# Patient Record
Sex: Male | Born: 1960
Health system: Southern US, Community
[De-identification: ages and names within clinical notes are randomized; demographics above are authoritative.]

## PROBLEM LIST (undated history)

## (undated) DIAGNOSIS — E785 Hyperlipidemia, unspecified: Secondary | ICD-10-CM

## (undated) DIAGNOSIS — I10 Essential (primary) hypertension: Secondary | ICD-10-CM

## (undated) DIAGNOSIS — L659 Nonscarring hair loss, unspecified: Secondary | ICD-10-CM

## (undated) DIAGNOSIS — E119 Type 2 diabetes mellitus without complications: Secondary | ICD-10-CM

## (undated) DIAGNOSIS — L4050 Arthropathic psoriasis, unspecified: Secondary | ICD-10-CM

## (undated) HISTORY — DX: Essential (primary) hypertension: I10

## (undated) HISTORY — DX: Nonscarring hair loss, unspecified: L65.9

## (undated) HISTORY — PX: TYMPANOPLASTY: SHX33

## (undated) HISTORY — DX: Arthropathic psoriasis, unspecified: L40.50

## (undated) HISTORY — DX: Hyperlipidemia, unspecified: E78.5

---

## 2015-07-21 LAB — HM COLONOSCOPY

## 2016-09-19 DIAGNOSIS — L409 Psoriasis, unspecified: Secondary | ICD-10-CM | POA: Insufficient documentation

## 2017-04-15 DIAGNOSIS — Z79899 Other long term (current) drug therapy: Secondary | ICD-10-CM | POA: Diagnosis not present

## 2017-04-15 DIAGNOSIS — L4059 Other psoriatic arthropathy: Secondary | ICD-10-CM | POA: Diagnosis not present

## 2017-04-21 DIAGNOSIS — Z125 Encounter for screening for malignant neoplasm of prostate: Secondary | ICD-10-CM | POA: Diagnosis not present

## 2017-04-21 DIAGNOSIS — I10 Essential (primary) hypertension: Secondary | ICD-10-CM | POA: Diagnosis not present

## 2017-04-21 DIAGNOSIS — Z23 Encounter for immunization: Secondary | ICD-10-CM | POA: Diagnosis not present

## 2017-04-21 DIAGNOSIS — R739 Hyperglycemia, unspecified: Secondary | ICD-10-CM | POA: Diagnosis not present

## 2017-04-21 DIAGNOSIS — L409 Psoriasis, unspecified: Secondary | ICD-10-CM | POA: Diagnosis not present

## 2017-04-22 LAB — PSA: PSA: 0.6

## 2017-04-22 LAB — HEPATIC FUNCTION PANEL
ALT: 39 (ref 10–40)
AST: 27 (ref 14–40)
BILIRUBIN, TOTAL: 0.6

## 2017-04-22 LAB — HEMOGLOBIN A1C: HEMOGLOBIN A1C: 6.3

## 2017-04-22 LAB — CBC AND DIFFERENTIAL
HEMOGLOBIN: 16 (ref 13.5–17.5)
PLATELETS: 305 (ref 150–399)
WBC: 9.3

## 2017-04-22 LAB — BASIC METABOLIC PANEL
BUN: 10 (ref 4–21)
Creatinine: 0.9 (ref 0.6–1.3)
GLUCOSE: 136
Potassium: 4.3 (ref 3.4–5.3)
Sodium: 140 (ref 137–147)

## 2017-04-22 LAB — LIPID PANEL
Cholesterol: 102 (ref 0–200)
HDL: 26 — AB (ref 35–70)
LDL CALC: 58
TRIGLYCERIDES: 90 (ref 40–160)

## 2017-05-04 DIAGNOSIS — L400 Psoriasis vulgaris: Secondary | ICD-10-CM | POA: Diagnosis not present

## 2017-05-07 DIAGNOSIS — J208 Acute bronchitis due to other specified organisms: Secondary | ICD-10-CM | POA: Diagnosis not present

## 2017-05-07 DIAGNOSIS — B9689 Other specified bacterial agents as the cause of diseases classified elsewhere: Secondary | ICD-10-CM | POA: Diagnosis not present

## 2017-05-09 HISTORY — PX: COLONOSCOPY: SHX174

## 2017-05-21 DIAGNOSIS — B9689 Other specified bacterial agents as the cause of diseases classified elsewhere: Secondary | ICD-10-CM | POA: Diagnosis not present

## 2017-05-21 DIAGNOSIS — J329 Chronic sinusitis, unspecified: Secondary | ICD-10-CM | POA: Diagnosis not present

## 2017-05-21 DIAGNOSIS — M26621 Arthralgia of right temporomandibular joint: Secondary | ICD-10-CM | POA: Diagnosis not present

## 2017-06-17 ENCOUNTER — Ambulatory Visit: Payer: BLUE CROSS/BLUE SHIELD | Admitting: Internal Medicine

## 2017-06-17 ENCOUNTER — Encounter: Payer: Self-pay | Admitting: Internal Medicine

## 2017-06-17 VITALS — BP 118/78 | HR 107 | Ht 72.0 in | Wt 245.0 lb

## 2017-06-17 DIAGNOSIS — I10 Essential (primary) hypertension: Secondary | ICD-10-CM

## 2017-06-17 DIAGNOSIS — L4050 Arthropathic psoriasis, unspecified: Secondary | ICD-10-CM

## 2017-06-17 DIAGNOSIS — R7303 Prediabetes: Secondary | ICD-10-CM

## 2017-06-17 DIAGNOSIS — E1169 Type 2 diabetes mellitus with other specified complication: Secondary | ICD-10-CM | POA: Insufficient documentation

## 2017-06-17 DIAGNOSIS — E1159 Type 2 diabetes mellitus with other circulatory complications: Secondary | ICD-10-CM

## 2017-06-17 DIAGNOSIS — E785 Hyperlipidemia, unspecified: Secondary | ICD-10-CM

## 2017-06-17 DIAGNOSIS — L659 Nonscarring hair loss, unspecified: Secondary | ICD-10-CM | POA: Insufficient documentation

## 2017-06-17 DIAGNOSIS — E782 Mixed hyperlipidemia: Secondary | ICD-10-CM

## 2017-06-17 DIAGNOSIS — E118 Type 2 diabetes mellitus with unspecified complications: Secondary | ICD-10-CM | POA: Insufficient documentation

## 2017-06-17 NOTE — Progress Notes (Signed)
Date:  06/17/2017   Name:  Russell Wright   DOB:  [birth year]   MRN:  [id number]  Transferred from [facility] due to insurance.  Chief Complaint: Establish Care and Fatigue (Feels like has no will to get up and go. Went through divorce in the last year. ) Hypertension  This is a chronic problem. The problem is controlled. Pertinent negatives include no chest pain, headaches, palpitations or shortness of breath. Past treatments include ACE inhibitors. The current treatment provides significant improvement.  Hyperlipidemia  This is a chronic problem. Pertinent negatives include no chest pain or shortness of breath. Current antihyperlipidemic treatment includes statins. The current treatment provides significant improvement of lipids. There are no compliance problems.    Pre-diabetes - has had an A1C 6.5 but did not start metformin as recommended.  A follow up A1C was 6.3.  He tried metformin but could not tolerate the diarrhea.  Psoriatic Arthritis - diagnosed several years ago. Now on MTX and Folate. Primarily knees and one finger. Followed by Dr. Nicoletta Ba.  Fatigue - no longer in a physical job - mostly just driving around.  He has gained some weight and is not exercising.  He admits to a poor diet - eating out more than before.   Review of Systems  Constitutional: Positive for fatigue. Negative for chills, fever and unexpected weight change.  Eyes: Negative for visual disturbance.  Respiratory: Negative for chest tightness, shortness of breath and wheezing.   Cardiovascular: Negative for chest pain, palpitations and leg swelling.  Gastrointestinal: Negative for abdominal pain, constipation and diarrhea.  Musculoskeletal: Positive for arthralgias. Negative for back pain and gait problem.  Skin: Negative for color change and wound.  Neurological: Negative for dizziness, numbness and headaches.  Psychiatric/Behavioral: Negative for sleep disturbance.    Patient Active Problem List   Diagnosis  Date Noted  . Psoriatic arthritis (HCC) 06/17/2017  . Essential hypertension 06/17/2017  . Hyperlipemia 06/17/2017  . Hair loss     Prior to Admission medications   Medication Sig Start Date End Date Taking? Authorizing Provider  atorvastatin (LIPITOR) 20 MG tablet Take 20 mg by mouth daily.   Yes [provider]  folic acid (FOLVITE) 1 MG tablet Take 1 mg by mouth daily.   Yes [provider]  lisinopril (PRINIVIL,ZESTRIL) 20 MG tablet Take 20 mg by mouth daily.   Yes [provider]  methotrexate 2.5 MG tablet Take by mouth once a week. Weekly on thursdays.   Yes [provider]    No Known Allergies  Past Surgical History:  Procedure Laterality Date  . COLONOSCOPY  05/09/2017   cleared for 10 years  . TYMPANOPLASTY     repair- 57 years old    Social History   Tobacco Use  . Smoking status: Never Smoker  . Smokeless tobacco: Never Used  Substance Use Topics  . Alcohol use: Yes    Comment: occasions  . Drug use: No     Medication list has been reviewed and updated.  PHQ 2/9 Scores 06/17/2017  PHQ - 2 Score 0    Physical Exam  Constitutional: He is oriented to person, place, and time. He appears well-developed. No distress.  HENT:  Head: Normocephalic and atraumatic.  Cardiovascular: Normal rate, regular rhythm and normal heart sounds.  Pulmonary/Chest: Effort normal. No respiratory distress.  Musculoskeletal: He exhibits no edema.  Neurological: He is alert and oriented to person, place, and time.  Skin: Skin is warm and dry. No rash  noted.  Psychiatric: He has a normal mood and affect. His behavior is normal. Thought content normal.  Nursing note and vitals reviewed.   BP 118/78   Pulse (!) 107   Ht 6' (1.829 m)   Wt 245 lb (111.1 kg)   SpO2 95%   BMI 33.23 kg/m   Assessment and Plan: 1. Essential hypertension controlled  2. Mixed hyperlipidemia On statin therapy  3. Pre-diabetes Stressed need for healthy  diet, regular exercise and weight loss No medications for now - recheck at next visit  4. Psoriatic arthritis (HCC) On MTX Followed by Rheumatology   No orders of the defined types were placed in this encounter.   Partially dictated using Animal nutritionistDragon software. Any errors are unintentional.  Bari EdwardLaura Akasia Ahmad, MD [facility]  06/17/2017

## 2017-06-17 NOTE — Patient Instructions (Addendum)
B complex vitamin take daily  Aerobic exercise - 30-45 min 5 times per week

## 2017-08-03 DIAGNOSIS — L820 Inflamed seborrheic keratosis: Secondary | ICD-10-CM | POA: Diagnosis not present

## 2017-08-03 DIAGNOSIS — L400 Psoriasis vulgaris: Secondary | ICD-10-CM | POA: Diagnosis not present

## 2017-08-10 DIAGNOSIS — L4050 Arthropathic psoriasis, unspecified: Secondary | ICD-10-CM | POA: Diagnosis not present

## 2017-08-10 LAB — HEPATIC FUNCTION PANEL
ALT: 47 — AB (ref 10–40)
AST: 36 (ref 14–40)

## 2017-08-10 LAB — CBC AND DIFFERENTIAL
HEMOGLOBIN: 15.9 (ref 13.5–17.5)
PLATELETS: 270 (ref 150–399)
WBC: 8.8

## 2017-08-10 LAB — BASIC METABOLIC PANEL: Creatinine: 0.8 (ref 0.6–1.3)

## 2017-08-11 LAB — CBC AND DIFFERENTIAL
HCT: 45 (ref 41–53)
Hemoglobin: 15.9 (ref 13.5–17.5)
Platelets: 270 (ref 150–399)
WBC: 8.8

## 2017-08-11 LAB — HEPATIC FUNCTION PANEL
ALT: 47 — AB (ref 10–40)
AST: 36 (ref 14–40)
Alkaline Phosphatase: 98 (ref 25–125)

## 2017-08-12 ENCOUNTER — Encounter: Payer: Self-pay | Admitting: Internal Medicine

## 2017-09-15 ENCOUNTER — Ambulatory Visit: Payer: BLUE CROSS/BLUE SHIELD | Admitting: Internal Medicine

## 2017-09-15 ENCOUNTER — Encounter: Payer: Self-pay | Admitting: Internal Medicine

## 2017-09-15 VITALS — BP 138/94 | HR 69 | Ht 72.0 in | Wt 239.0 lb

## 2017-09-15 DIAGNOSIS — R7303 Prediabetes: Secondary | ICD-10-CM | POA: Diagnosis not present

## 2017-09-15 DIAGNOSIS — G479 Sleep disorder, unspecified: Secondary | ICD-10-CM | POA: Diagnosis not present

## 2017-09-15 DIAGNOSIS — E782 Mixed hyperlipidemia: Secondary | ICD-10-CM | POA: Diagnosis not present

## 2017-09-15 DIAGNOSIS — I10 Essential (primary) hypertension: Secondary | ICD-10-CM

## 2017-09-15 MED ORDER — LISINOPRIL 40 MG PO TABS: 40.0000 mg | ORAL_TABLET | Freq: Every day | ORAL | 1 refills | Status: DC

## 2017-09-15 NOTE — Progress Notes (Signed)
Date:  09/15/2017   Name:  Russell DownsBradley Fuerstenberg   DOB:  1961/03/10   MRN:  [id number]   Chief Complaint: Hypertension Hypertension  Pertinent negatives include no chest pain, headaches, palpitations or shortness of breath.  Diabetes  Pertinent negatives for hypoglycemia include no headaches or tremors. Pertinent negatives for diabetes include no chest pain, no fatigue, no polydipsia and no polyuria.  Hyperlipidemia  Pertinent negatives include no chest pain or shortness of breath.  Insomnia  Primary symptoms: sleep disturbance (wakes up often and can't go back to sleep), premature morning awakening.  The problem occurs every several days. The problem is unchanged. Exacerbated by: sleeps in different places - with financee, at home or at parents house.  Psoriatic Arthritis - now on higher dose MTX and Folic acid.  Recent renal function, LFTs and CBC normal.  Lab Results  Component Value Date   HGBA1C 6.3 04/22/2017   Lab Results  Component Value Date   CHOL 102 04/22/2017   HDL 26 (A) 04/22/2017   LDLCALC 58 04/22/2017   TRIG 90 04/22/2017   Lab Results  Component Value Date   CREATININE 0.9 04/22/2017   BUN 10 04/22/2017   NA 140 04/22/2017   K 4.3 04/22/2017      Review of Systems  Constitutional: Negative for appetite change, fatigue and unexpected weight change.  Eyes: Negative for visual disturbance.  Respiratory: Negative for cough, shortness of breath and wheezing.   Cardiovascular: Negative for chest pain, palpitations and leg swelling.  Gastrointestinal: Negative for abdominal pain and blood in stool.  Endocrine: Negative for polydipsia and polyuria.  Genitourinary: Negative for dysuria and hematuria.  Skin: Negative for color change and rash.  Neurological: Negative for tremors, numbness and headaches.  Psychiatric/Behavioral: Positive for sleep disturbance (wakes up often and can't go back to sleep). Negative for dysphoric mood. The patient has insomnia.      Patient Active Problem List   Diagnosis Date Noted  . Psoriatic arthritis (HCC) 06/17/2017  . Essential hypertension 06/17/2017  . Hyperlipemia 06/17/2017  . Pre-diabetes 06/17/2017  . Hair loss   . Psoriasis, unspecified 09/19/2016    Prior to Admission medications   Medication Sig Start Date End Date Taking? Authorizing Provider  atorvastatin (LIPITOR) 20 MG tablet Take 20 mg by mouth daily.    [provider]  folic acid (FOLVITE) 1 MG tablet Take 1 mg by mouth daily.    [provider]  lisinopril (PRINIVIL,ZESTRIL) 20 MG tablet Take 20 mg by mouth daily.    [provider]  methotrexate 2.5 MG tablet Take by mouth once a week. Weekly on thursdays.    [provider]    No Known Allergies  Past Surgical History:  Procedure Laterality Date  . COLONOSCOPY  05/09/2017   cleared for 10 years  . TYMPANOPLASTY     repair- 57 years old    Social History   Tobacco Use  . Smoking status: Never Smoker  . Smokeless tobacco: Never Used  Substance Use Topics  . Alcohol use: Yes    Comment: occasions  . Drug use: No     Medication list has been reviewed and updated.  PHQ 2/9 Scores 06/17/2017  PHQ - 2 Score 0    Physical Exam  Constitutional: He is oriented to person, place, and time. He appears well-developed. No distress.  HENT:  Head: Normocephalic and atraumatic.  Neck: Normal range of motion. Neck supple. Carotid bruit is not present. No thyromegaly present.  Cardiovascular: Normal rate, regular rhythm and normal heart sounds.  Pulmonary/Chest: Effort normal and breath sounds normal. No respiratory distress. He has no wheezes.  Musculoskeletal: Normal range of motion. He exhibits no edema or tenderness.  Neurological: He is alert and oriented to person, place, and time.  Skin: Skin is warm and dry. No rash noted.  Psychiatric: He has a normal mood and affect. His behavior is normal. Thought content normal.  Nursing note and  vitals reviewed.   BP (!) 138/94   Pulse 69   Ht 6' (1.829 m)   Wt 239 lb (108.4 kg)   SpO2 95%   BMI 32.41 kg/m   Assessment and Plan: 1. Essential hypertension Not well controlled - increase lisinopril to 40 mg - lisinopril (PRINIVIL,ZESTRIL) 40 MG tablet; Take 1 tablet (40 mg total) by mouth daily.  Dispense: 90 tablet; Refill: 1  2. Mixed hyperlipidemia Continue statin therapy  3. Pre-diabetes Continue diet, weight loss - Hemoglobin A1c  4. Sleep disturbance Sleep hygiene discussed Trial of melatonin recommended  Meds ordered this encounter  Medications  . lisinopril (PRINIVIL,ZESTRIL) 40 MG tablet    Sig: Take 1 tablet (40 mg total) by mouth daily.    Dispense:  90 tablet    Refill:  1    Partially dictated using Animal nutritionist. Any errors are unintentional.  Bari Edward, MD [facility]  09/15/2017

## 2017-09-16 LAB — HEMOGLOBIN A1C
ESTIMATED AVERAGE GLUCOSE: 148 mg/dL
Hgb A1c MFr Bld: 6.8 % — ABNORMAL HIGH (ref 4.8–5.6)

## 2017-09-22 ENCOUNTER — Telehealth: Payer: Self-pay

## 2017-09-22 NOTE — Telephone Encounter (Signed)
Patient called about recent lab results. Informed about diabetic range for A1C and recommended diabetic education classes. Also, informed him of needing to work on low carb diet and weight loss.   He stated his mother and father are diabetics and he would like to learn from them about anything needed for diabetes so he declined the classes at this time. Will work on diet and exercise and recheck at next visit.

## 2017-10-26 DIAGNOSIS — L400 Psoriasis vulgaris: Secondary | ICD-10-CM | POA: Diagnosis not present

## 2017-11-19 DIAGNOSIS — L4059 Other psoriatic arthropathy: Secondary | ICD-10-CM | POA: Diagnosis not present

## 2017-11-19 DIAGNOSIS — Z79899 Other long term (current) drug therapy: Secondary | ICD-10-CM | POA: Diagnosis not present

## 2017-12-09 ENCOUNTER — Other Ambulatory Visit: Payer: Self-pay

## 2017-12-09 MED ORDER — ATORVASTATIN CALCIUM 20 MG PO TABS: 20.0000 mg | ORAL_TABLET | Freq: Every day | ORAL | 0 refills | Status: DC

## 2018-02-10 ENCOUNTER — Ambulatory Visit: Payer: BLUE CROSS/BLUE SHIELD | Admitting: Internal Medicine

## 2018-02-10 ENCOUNTER — Encounter: Payer: Self-pay | Admitting: Internal Medicine

## 2018-02-10 VITALS — BP 136/96 | HR 92 | Ht 72.0 in | Wt 243.0 lb

## 2018-02-10 DIAGNOSIS — K119 Disease of salivary gland, unspecified: Secondary | ICD-10-CM

## 2018-02-10 MED ORDER — AMOXICILLIN-POT CLAVULANATE 875-125 MG PO TABS: 1.0000 | ORAL_TABLET | Freq: Two times a day (BID) | ORAL | 0 refills | Status: AC

## 2018-02-10 NOTE — Patient Instructions (Signed)
Suck on lemon drops at least 3-4 times per day.

## 2018-02-10 NOTE — Progress Notes (Signed)
Date:  02/10/2018   Name:  Russell Wright   DOB:  [birth year]   MRN:  [id number]   Chief Complaint: Jaw Pain (X 2 weeks. Ride side of face is slightly swollen and when moving neck the right side feels tight. Pain of 4 when moving. )  He noted non painful swelling on the right side of his jaw 2 weeks ago.  It is slightly bigger the last few days.  He has no pain, no otalgia, no dental issues, no mouth sores, no sinus pressure. He is a non smoker. He has had no trauma. He feels well.  Review of Systems  Constitutional: Negative for chills, fatigue and fever.  HENT: Negative for congestion, dental problem, ear pain, mouth sores, sinus pressure, sore throat and trouble swallowing.   Respiratory: Negative for cough, chest tightness and shortness of breath.   Cardiovascular: Negative for chest pain and palpitations.  Allergic/Immunologic: Negative for environmental allergies.    Patient Active Problem List   Diagnosis Date Noted  . Sleep disturbance 09/15/2017  . Psoriatic arthritis (HCC) 06/17/2017  . Essential hypertension 06/17/2017  . Hyperlipemia 06/17/2017  . Pre-diabetes 06/17/2017  . Hair loss   . Psoriasis, unspecified 09/19/2016    No Known Allergies  Past Surgical History:  Procedure Laterality Date  . COLONOSCOPY  05/09/2017   cleared for 10 years  . TYMPANOPLASTY     repair- 57 years old    Social History   Tobacco Use  . Smoking status: Never Smoker  . Smokeless tobacco: Never Used  Substance Use Topics  . Alcohol use: Yes    Comment: occasions  . Drug use: No     Medication list has been reviewed and updated.  Current Meds  Medication Sig  . atorvastatin (LIPITOR) 20 MG tablet Take 1 tablet (20 mg total) by mouth daily.  . folic acid (FOLVITE) 1 MG tablet Take 2 mg by mouth daily.   Marland Kitchen lisinopril (PRINIVIL,ZESTRIL) 40 MG tablet Take 1 tablet (40 mg total) by mouth daily.  . methotrexate 2.5 MG tablet Take 20 mg by mouth once a week. Weekly on  thursdays.     PHQ 2/9 Scores 06/17/2017  PHQ - 2 Score 0    Physical Exam  Constitutional: He is oriented to person, place, and time. He appears well-developed. No distress.  HENT:  Head: Normocephalic and atraumatic.    Nose: Right sinus exhibits no maxillary sinus tenderness. Left sinus exhibits no maxillary sinus tenderness.  Mouth/Throat: Oropharynx is clear and moist and mucous membranes are normal.  Neck: Normal range of motion. Neck supple.  Cardiovascular: Normal rate, regular rhythm and normal heart sounds.  Pulmonary/Chest: Effort normal and breath sounds normal. No respiratory distress.  Musculoskeletal: Normal range of motion.  Lymphadenopathy:    He has no cervical adenopathy.  Neurological: He is alert and oriented to person, place, and time.  Skin: Skin is warm and dry. No rash noted.  Psychiatric: He has a normal mood and affect. His behavior is normal. Thought content normal.  Nursing note and vitals reviewed.   BP (!) 136/96 (BP Location: Right Arm, Patient Position: Sitting, Cuff Size: Large)   Pulse 92   Ht 6' (1.829 m)   Wt 243 lb (110.2 kg)   SpO2 97%   BMI 32.96 kg/m   Assessment and Plan: 1. Salivary gland disorder Recommend lemon drops to encourage salivary gland excretions Call for ENT referral if no improvement - amoxicillin-clavulanate (AUGMENTIN) 875-125 MG tablet; Take 1  tablet by mouth 2 (two) times daily for 10 days.  Dispense: 20 tablet; Refill: 0   Meds ordered this encounter  Medications  . amoxicillin-clavulanate (AUGMENTIN) 875-125 MG tablet    Sig: Take 1 tablet by mouth 2 (two) times daily for 10 days.    Dispense:  20 tablet    Refill:  0    Partially dictated using Animal nutritionist. Any errors are unintentional.  Bari Edward, MD [facility]  02/10/2018  There are no diagnoses linked to this encounter.

## 2018-02-18 DIAGNOSIS — L4059 Other psoriatic arthropathy: Secondary | ICD-10-CM | POA: Diagnosis not present

## 2018-03-03 ENCOUNTER — Other Ambulatory Visit: Payer: Self-pay | Admitting: Internal Medicine

## 2018-03-17 ENCOUNTER — Encounter: Payer: Self-pay | Admitting: Internal Medicine

## 2018-03-17 ENCOUNTER — Ambulatory Visit: Payer: BLUE CROSS/BLUE SHIELD | Admitting: Internal Medicine

## 2018-03-17 VITALS — BP 126/84 | HR 106 | Temp 98.3°F | Ht 72.0 in | Wt 238.0 lb

## 2018-03-17 DIAGNOSIS — J0100 Acute maxillary sinusitis, unspecified: Secondary | ICD-10-CM

## 2018-03-17 DIAGNOSIS — I10 Essential (primary) hypertension: Secondary | ICD-10-CM

## 2018-03-17 DIAGNOSIS — Z125 Encounter for screening for malignant neoplasm of prostate: Secondary | ICD-10-CM | POA: Diagnosis not present

## 2018-03-17 DIAGNOSIS — L4050 Arthropathic psoriasis, unspecified: Secondary | ICD-10-CM | POA: Diagnosis not present

## 2018-03-17 DIAGNOSIS — R7303 Prediabetes: Secondary | ICD-10-CM | POA: Diagnosis not present

## 2018-03-17 DIAGNOSIS — E782 Mixed hyperlipidemia: Secondary | ICD-10-CM | POA: Diagnosis not present

## 2018-03-17 DIAGNOSIS — Z0000 Encounter for general adult medical examination without abnormal findings: Secondary | ICD-10-CM

## 2018-03-17 LAB — POCT URINALYSIS DIPSTICK
BILIRUBIN UA: NEGATIVE
GLUCOSE UA: NEGATIVE
Ketones, UA: NEGATIVE
LEUKOCYTES UA: NEGATIVE
Nitrite, UA: NEGATIVE
Protein, UA: NEGATIVE
Spec Grav, UA: 1.02 (ref 1.010–1.025)
Urobilinogen, UA: 0.2 E.U./dL
pH, UA: 6 (ref 5.0–8.0)

## 2018-03-17 MED ORDER — AMOXICILLIN-POT CLAVULANATE 875-125 MG PO TABS: 1.0000 | ORAL_TABLET | Freq: Two times a day (BID) | ORAL | 0 refills | Status: AC

## 2018-03-17 NOTE — Progress Notes (Signed)
Date:  03/17/2018   Name:  Russell Wright   DOB:  29-May-1961   MRN:  [id number]   Chief Complaint: Annual Exam; Sore Throat (Started Monday night. Fever, sorethroat, coughing- green mucous. Throat is hurting when swallow.); and Hypertension Russell Wright is a 57 y.o. male who presents today for his Complete Annual Exam. He feels well in general but is sick today. He reports exercising none. He reports he is sleeping poorly at times.   Sore Throat   This is a new problem. The current episode started yesterday. The problem has been unchanged. Neither side of throat is experiencing more pain than the other. The maximum temperature recorded prior to his arrival was 100.4 - 100.9 F. The pain is moderate. Associated symptoms include trouble swallowing. Pertinent negatives include no abdominal pain, coughing, diarrhea, headaches, hoarse voice or shortness of breath. He has tried acetaminophen for the symptoms. The treatment provided mild relief.  Hypertension  This is a chronic problem. The problem is controlled. Pertinent negatives include no chest pain, headaches, palpitations or shortness of breath. Risk factors for coronary artery disease include dyslipidemia. Past treatments include ACE inhibitors. The current treatment provides significant improvement.  Hyperlipidemia  This is a chronic problem. The problem is controlled. Pertinent negatives include no chest pain, myalgias or shortness of breath. Current antihyperlipidemic treatment includes statins. The current treatment provides significant improvement of lipids.    Review of Systems  Constitutional: Positive for chills and fatigue. Negative for appetite change, diaphoresis and unexpected weight change.  HENT: Positive for sore throat and trouble swallowing. Negative for hearing loss, hoarse voice, tinnitus and voice change.   Eyes: Negative for visual disturbance.  Respiratory: Negative for cough, choking, shortness of breath and wheezing.     Cardiovascular: Negative for chest pain, palpitations and leg swelling.  Gastrointestinal: Negative for abdominal pain, blood in stool, constipation and diarrhea.  Genitourinary: Negative for difficulty urinating, dysuria and frequency.  Musculoskeletal: Negative for arthralgias, back pain and myalgias.  Skin: Negative for color change and rash.  Neurological: Negative for dizziness, syncope and headaches.  Hematological: Negative for adenopathy.  Psychiatric/Behavioral: Positive for sleep disturbance (intermittent early waking). Negative for dysphoric mood.    Patient Active Problem List   Diagnosis Date Noted  . Sleep disturbance 09/15/2017  . Psoriatic arthritis (HCC) 06/17/2017  . Essential hypertension 06/17/2017  . Hyperlipemia 06/17/2017  . Pre-diabetes 06/17/2017  . Hair loss   . Psoriasis, unspecified 09/19/2016    No Known Allergies  Past Surgical History:  Procedure Laterality Date  . COLONOSCOPY  05/09/2017   cleared for 10 years  . TYMPANOPLASTY     repair- 57 years old    Social History   Tobacco Use  . Smoking status: Never Smoker  . Smokeless tobacco: Never Used  Substance Use Topics  . Alcohol use: Yes    Comment: occasions  . Drug use: No     Medication list has been reviewed and updated.  Current Meds  Medication Sig  . atorvastatin (LIPITOR) 20 MG tablet TAKE 1 TABLET BY MOUTH ONCE DAILY  . folic acid (FOLVITE) 1 MG tablet Take 2 mg by mouth daily.   Russell Wright lisinopril (PRINIVIL,ZESTRIL) 40 MG tablet Take 1 tablet (40 mg total) by mouth daily.  . methotrexate 2.5 MG tablet Take 20 mg by mouth once a week. Weekly on thursdays.     PHQ 2/9 Scores 06/17/2017  PHQ - 2 Score 0    Physical Exam  Constitutional: He is  oriented to person, place, and time. He appears well-developed and well-nourished.  HENT:  Head: Normocephalic.  Right Ear: Tympanic membrane, external ear and ear canal normal.  Left Ear: Tympanic membrane, external ear and ear canal  normal.  Nose: Right sinus exhibits maxillary sinus tenderness. Left sinus exhibits maxillary sinus tenderness.  Mouth/Throat: Uvula is midline and mucous membranes are normal. Posterior oropharyngeal edema and posterior oropharyngeal erythema present. No oropharyngeal exudate.  Eyes: Pupils are equal, round, and reactive to light. Conjunctivae and EOM are normal.  Neck: Normal range of motion. Neck supple. Carotid bruit is not present. No thyromegaly present.  Cardiovascular: Normal rate, regular rhythm, normal heart sounds and intact distal pulses.  Pulmonary/Chest: Effort normal and breath sounds normal. He has no wheezes. Right breast exhibits no mass. Left breast exhibits no mass.  Abdominal: Soft. Normal appearance and bowel sounds are normal. There is no hepatosplenomegaly. There is no tenderness.  Musculoskeletal: Normal range of motion.  Lymphadenopathy:    He has no cervical adenopathy.  Neurological: He is alert and oriented to person, place, and time. He has normal reflexes.  Skin: Skin is warm, dry and intact.  Psychiatric: He has a normal mood and affect. His speech is normal and behavior is normal. Judgment and thought content normal.  Nursing note and vitals reviewed.   BP 126/84 (BP Location: Right Arm, Patient Position: Sitting, Cuff Size: Normal)   Pulse (!) 106   Temp 98.3 F (36.8 C) (Oral)   Ht 6' (1.829 m)   Wt 238 lb (108 kg)   SpO2 94%   BMI 32.28 kg/m   Assessment and Plan: 1. Annual physical exam Continue health diet, begin regular exercise - POCT urinalysis dipstick  2. Prostate cancer screening DRE deferred - PSA  3. Essential hypertension controlled - CBC with Differential/Platelet - Comprehensive metabolic panel  4. Psoriatic arthritis (HCC) On MTX  5. Mixed hyperlipidemia Check labs, continue statin - Lipid panel  6. Pre-diabetes - Hemoglobin A1c  7. Acute non-recurrent maxillary sinusitis Begin Claritin, tylenol, Delsym -  amoxicillin-clavulanate (AUGMENTIN) 875-125 MG tablet; Take 1 tablet by mouth 2 (two) times daily for 10 days.  Dispense: 20 tablet; Refill: 0   Partially dictated using Animal nutritionist. Any errors are unintentional.  Russell Edward, MD [facility]  03/17/2018

## 2018-03-18 ENCOUNTER — Other Ambulatory Visit: Payer: Self-pay | Admitting: Internal Medicine

## 2018-03-18 DIAGNOSIS — E1159 Type 2 diabetes mellitus with other circulatory complications: Secondary | ICD-10-CM

## 2018-03-18 LAB — CBC WITH DIFFERENTIAL/PLATELET
BASOS: 1 %
Basophils Absolute: 0.1 10*3/uL (ref 0.0–0.2)
EOS (ABSOLUTE): 0.1 10*3/uL (ref 0.0–0.4)
EOS: 1 %
HEMATOCRIT: 47.8 % (ref 37.5–51.0)
Hemoglobin: 16 g/dL (ref 13.0–17.7)
IMMATURE GRANS (ABS): 0 10*3/uL (ref 0.0–0.1)
IMMATURE GRANULOCYTES: 0 %
Lymphocytes Absolute: 1.4 10*3/uL (ref 0.7–3.1)
Lymphs: 15 %
MCH: 29.7 pg (ref 26.6–33.0)
MCHC: 33.5 g/dL (ref 31.5–35.7)
MCV: 89 fL (ref 79–97)
MONOS ABS: 0.8 10*3/uL (ref 0.1–0.9)
Monocytes: 8 %
NEUTROS PCT: 75 %
Neutrophils Absolute: 7.1 10*3/uL — ABNORMAL HIGH (ref 1.4–7.0)
Platelets: 251 10*3/uL (ref 150–450)
RBC: 5.38 x10E6/uL (ref 4.14–5.80)
RDW: 13.3 % (ref 12.3–15.4)
WBC: 9.5 10*3/uL (ref 3.4–10.8)

## 2018-03-18 LAB — COMPREHENSIVE METABOLIC PANEL
ALBUMIN: 4.6 g/dL (ref 3.5–5.5)
ALK PHOS: 87 IU/L (ref 39–117)
ALT: 48 IU/L — ABNORMAL HIGH (ref 0–44)
AST: 32 IU/L (ref 0–40)
Albumin/Globulin Ratio: 1.6 (ref 1.2–2.2)
BUN/Creatinine Ratio: 12 (ref 9–20)
BUN: 10 mg/dL (ref 6–24)
Bilirubin Total: 0.3 mg/dL (ref 0.0–1.2)
CO2: 23 mmol/L (ref 20–29)
CREATININE: 0.83 mg/dL (ref 0.76–1.27)
Calcium: 9.3 mg/dL (ref 8.7–10.2)
Chloride: 100 mmol/L (ref 96–106)
GFR calc Af Amer: 115 mL/min/{1.73_m2} (ref >59)
GFR calc non Af Amer: 99 mL/min/{1.73_m2} (ref >59)
GLOBULIN, TOTAL: 2.8 g/dL (ref 1.5–4.5)
GLUCOSE: 138 mg/dL — AB (ref 65–99)
Potassium: 4.2 mmol/L (ref 3.5–5.2)
Sodium: 140 mmol/L (ref 134–144)
Total Protein: 7.4 g/dL (ref 6.0–8.5)

## 2018-03-18 LAB — HEMOGLOBIN A1C
Est. average glucose Bld gHb Est-mCnc: 160 mg/dL
Hgb A1c MFr Bld: 7.2 % — ABNORMAL HIGH (ref 4.8–5.6)

## 2018-03-18 LAB — LIPID PANEL
CHOL/HDL RATIO: 4.2 ratio (ref 0.0–5.0)
CHOLESTEROL TOTAL: 134 mg/dL (ref 100–199)
HDL: 32 mg/dL — ABNORMAL LOW (ref >39)
LDL CALC: 72 mg/dL (ref 0–99)
TRIGLYCERIDES: 149 mg/dL (ref 0–149)
VLDL CHOLESTEROL CAL: 30 mg/dL (ref 5–40)

## 2018-03-18 LAB — PSA: Prostate Specific Ag, Serum: 0.5 ng/mL (ref 0.0–4.0)

## 2018-03-18 MED ORDER — METFORMIN HCL ER 500 MG PO TB24: 500.0000 mg | ORAL_TABLET | Freq: Every day | ORAL | 5 refills | Status: DC

## 2018-03-18 NOTE — Progress Notes (Signed)
Spoke with patient. Informed him of new Dm. Informed he needs to start taking metformin once daily with food. Told to work on diet and weight loss. He needs metformin called into pharmacy and would also like referral for DM education classes. Transferred patient to the front desk to schedule a 3 month follow up and recheck for A1C.

## 2018-05-20 DIAGNOSIS — L4059 Other psoriatic arthropathy: Secondary | ICD-10-CM | POA: Diagnosis not present

## 2018-06-07 ENCOUNTER — Other Ambulatory Visit: Payer: Self-pay | Admitting: Internal Medicine

## 2018-06-07 DIAGNOSIS — I10 Essential (primary) hypertension: Secondary | ICD-10-CM

## 2018-06-22 ENCOUNTER — Ambulatory Visit: Payer: BLUE CROSS/BLUE SHIELD | Admitting: Internal Medicine

## 2018-06-22 ENCOUNTER — Encounter: Payer: Self-pay | Admitting: Internal Medicine

## 2018-06-22 VITALS — BP 136/88 | HR 78 | Ht 72.0 in | Wt 242.0 lb

## 2018-06-22 DIAGNOSIS — E1159 Type 2 diabetes mellitus with other circulatory complications: Secondary | ICD-10-CM

## 2018-06-22 DIAGNOSIS — Z23 Encounter for immunization: Secondary | ICD-10-CM

## 2018-06-22 DIAGNOSIS — I10 Essential (primary) hypertension: Secondary | ICD-10-CM

## 2018-06-22 DIAGNOSIS — H6123 Impacted cerumen, bilateral: Secondary | ICD-10-CM

## 2018-06-22 MED ORDER — SITAGLIPTIN PHOSPHATE 100 MG PO TABS: 100.0000 mg | ORAL_TABLET | Freq: Every day | ORAL | 5 refills | Status: DC

## 2018-06-22 NOTE — Patient Instructions (Signed)
Schedule Diabetic eye exam  Take Januvia at suppertime

## 2018-06-22 NOTE — Progress Notes (Signed)
Date:  06/22/2018   Name:  Russell Wright   DOB:  08/30/1960   MRN:  [id number]   Chief Complaint: Diabetes (Stopped Metformin because it caused him to have diarrhea. )  Diabetes  He presents for his follow-up diabetic visit. He has type 2 diabetes mellitus. The initial diagnosis of diabetes was made 6 months ago. Pertinent negatives for hypoglycemia include no dizziness, headaches or tremors. Pertinent negatives for diabetes include no chest pain, no fatigue, no polydipsia and no polyuria. When asked about current treatments, none were reported. His weight is increasing steadily. An ACE inhibitor/angiotensin II receptor blocker is being taken.  Otalgia   There is pain in both ears. This is a chronic problem. The problem occurs constantly. There has been no fever. Pertinent negatives include no abdominal pain, coughing, headaches or rash.  new onset last visit with A1C 7.0.  Pt was referred to education but declined services. He is not taking metformin due to diarrhea.  He has gained 4 lbs.  He has tried to cut back on candy and soda. He is also on atorvastatin and lisinopril.  Review of Systems  Constitutional: Negative for appetite change, fatigue and unexpected weight change.  HENT: Positive for ear pain (ear fullness).   Eyes: Negative for visual disturbance.  Respiratory: Negative for cough, shortness of breath and wheezing.   Cardiovascular: Negative for chest pain, palpitations and leg swelling.  Gastrointestinal: Negative for abdominal pain and blood in stool.  Endocrine: Negative for polydipsia and polyuria.  Genitourinary: Negative for dysuria and hematuria.  Skin: Negative for color change and rash.  Neurological: Negative for dizziness, tremors, numbness and headaches.  Psychiatric/Behavioral: Negative for dysphoric mood.    Patient Active Problem List   Diagnosis Date Noted  . Sleep disturbance 09/15/2017  . Psoriatic arthritis (HCC) 06/17/2017  . Essential  hypertension 06/17/2017  . Hyperlipemia 06/17/2017  . Type 2 diabetes mellitus with vascular disease (HCC) 06/17/2017  . Hair loss   . Psoriasis, unspecified 09/19/2016    Allergies  Allergen Reactions  . Metformin And Related Diarrhea    Past Surgical History:  Procedure Laterality Date  . COLONOSCOPY  05/09/2017   cleared for 10 years  . TYMPANOPLASTY     repair- 58 years old    Social History   Tobacco Use  . Smoking status: Never Smoker  . Smokeless tobacco: Never Used  Substance Use Topics  . Alcohol use: Yes    Comment: occasions  . Drug use: No     Medication list has been reviewed and updated.  Current Meds  Medication Sig  . atorvastatin (LIPITOR) 20 MG tablet TAKE 1 TABLET BY MOUTH EVERY DAY  . folic acid (FOLVITE) 1 MG tablet Take 2 mg by mouth daily.   Marland Kitchen lisinopril (PRINIVIL,ZESTRIL) 40 MG tablet TAKE 1 TABLET BY MOUTH EVERY DAY  . methotrexate 2.5 MG tablet Take 20 mg by mouth once a week. Weekly on thursdays.     PHQ 2/9 Scores 06/22/2018 03/17/2018 06/17/2017  PHQ - 2 Score 0 - 0  Exception Documentation - Medical reason -    Physical Exam Vitals signs and nursing note reviewed.  Constitutional:      General: He is not in acute distress.    Appearance: He is well-developed.  HENT:     Head: Normocephalic and atraumatic.     Ears:     Comments: Cerumen impaction bilaterally - large amount removed from left canal with curette with return of normal hearing  Moderate amount removed from right ear with improvement in hearing - a moderate amount remains deep in the canal. Eyes:     Pupils: Pupils are equal, round, and reactive to light.  Neck:     Musculoskeletal: Normal range of motion and neck supple.  Cardiovascular:     Rate and Rhythm: Normal rate and regular rhythm.     Pulses: Normal pulses.  Pulmonary:     Effort: Pulmonary effort is normal. No respiratory distress.  Musculoskeletal: Normal range of motion.  Skin:    General: Skin is  warm and dry.     Findings: No rash.  Neurological:     Mental Status: He is alert and oriented to person, place, and time.  Psychiatric:        Behavior: Behavior normal.        Thought Content: Thought content normal.     BP 136/88   Pulse 78   Ht 6' (1.829 m)   Wt 242 lb (109.8 kg)   SpO2 96%   BMI 32.82 kg/m   Assessment and Plan: 1. Type 2 diabetes mellitus with vascular disease (HCC) Did not tolerate metformin - encouraged exercise and weight loss Schedule Eye exam - Hemoglobin A1c - sitaGLIPtin (JANUVIA) 100 MG tablet; Take 1 tablet (100 mg total) by mouth daily.  Dispense: 30 tablet; Refill: 5 - Ambulatory referral to diabetic education  2. Essential hypertension controlled  3. Hearing loss of both ears due to cerumen impaction Improved after removal   Partially dictated using Animal nutritionist. Any errors are unintentional.  Bari Edward, MD [facility]  06/22/2018

## 2018-06-23 LAB — HEMOGLOBIN A1C
Est. average glucose Bld gHb Est-mCnc: 171 mg/dL
Hgb A1c MFr Bld: 7.6 % — ABNORMAL HIGH (ref 4.8–5.6)

## 2018-08-23 ENCOUNTER — Other Ambulatory Visit: Payer: Self-pay | Admitting: Internal Medicine

## 2018-08-23 DIAGNOSIS — I10 Essential (primary) hypertension: Secondary | ICD-10-CM

## 2018-08-24 DIAGNOSIS — L4059 Other psoriatic arthropathy: Secondary | ICD-10-CM | POA: Diagnosis not present

## 2018-08-25 DIAGNOSIS — D485 Neoplasm of uncertain behavior of skin: Secondary | ICD-10-CM | POA: Diagnosis not present

## 2018-08-25 DIAGNOSIS — D224 Melanocytic nevi of scalp and neck: Secondary | ICD-10-CM | POA: Diagnosis not present

## 2018-08-25 DIAGNOSIS — D2230 Melanocytic nevi of unspecified part of face: Secondary | ICD-10-CM | POA: Diagnosis not present

## 2018-08-25 DIAGNOSIS — D225 Melanocytic nevi of trunk: Secondary | ICD-10-CM | POA: Diagnosis not present

## 2018-09-16 ENCOUNTER — Encounter: Payer: Self-pay | Admitting: Internal Medicine

## 2018-09-16 ENCOUNTER — Ambulatory Visit: Payer: BLUE CROSS/BLUE SHIELD | Admitting: Internal Medicine

## 2018-09-16 ENCOUNTER — Other Ambulatory Visit: Payer: Self-pay

## 2018-09-16 VITALS — BP 120/78 | HR 78 | Resp 16 | Ht 72.0 in | Wt 238.0 lb

## 2018-09-16 DIAGNOSIS — E785 Hyperlipidemia, unspecified: Secondary | ICD-10-CM | POA: Diagnosis not present

## 2018-09-16 DIAGNOSIS — E1159 Type 2 diabetes mellitus with other circulatory complications: Secondary | ICD-10-CM

## 2018-09-16 DIAGNOSIS — E1169 Type 2 diabetes mellitus with other specified complication: Secondary | ICD-10-CM

## 2018-09-16 DIAGNOSIS — I10 Essential (primary) hypertension: Secondary | ICD-10-CM | POA: Diagnosis not present

## 2018-09-16 NOTE — Progress Notes (Signed)
Date:  09/16/2018   Name:  Alma DownsBradley Mcguffin   DOB:  1961-01-02   MRN:  [id number]   Chief Complaint: Diabetes and Hypertension  Hypertension  This is a chronic problem. The problem is controlled. Pertinent negatives include no chest pain, headaches, palpitations or shortness of breath. Past treatments include ACE inhibitors. The current treatment provides significant improvement. There are no compliance problems.   Diabetes  He presents for his follow-up diabetic visit. He has type 2 diabetes mellitus. Pertinent negatives for hypoglycemia include no headaches or tremors. Pertinent negatives for diabetes include no chest pain, no fatigue, no polydipsia and no polyuria. Current diabetic treatments: januvia - did not tolerate metformin. He is compliant with treatment all of the time. There is no compliance (does not check his BS) with monitoring of blood glucose. An ACE inhibitor/angiotensin II receptor blocker is being taken. Eye exam is not current.  Hyperlipidemia  The problem is controlled. Pertinent negatives include no chest pain or shortness of breath. Current antihyperlipidemic treatment includes statins. The current treatment provides significant improvement of lipids.   Lab Results  Component Value Date   HGBA1C 7.6 (H) 06/22/2018   Lab Results  Component Value Date   CREATININE 0.83 03/17/2018   BUN 10 03/17/2018   NA 140 03/17/2018   K 4.2 03/17/2018   CL 100 03/17/2018   CO2 23 03/17/2018   Lab Results  Component Value Date   CHOL 134 03/17/2018   HDL 32 (L) 03/17/2018   LDLCALC 72 03/17/2018   TRIG 149 03/17/2018   CHOLHDL 4.2 03/17/2018     Review of Systems  Constitutional: Negative for appetite change, fatigue and unexpected weight change.  HENT: Negative for trouble swallowing.   Eyes: Negative for visual disturbance.  Respiratory: Negative for cough, shortness of breath and wheezing.   Cardiovascular: Negative for chest pain, palpitations and leg swelling.   Gastrointestinal: Negative for abdominal pain and blood in stool.  Endocrine: Negative for polydipsia and polyuria.  Genitourinary: Negative for dysuria and hematuria.  Skin: Negative for color change and rash.  Neurological: Negative for tremors, numbness and headaches.  Psychiatric/Behavioral: Negative for dysphoric mood.    Patient Active Problem List   Diagnosis Date Noted  . Sleep disturbance 09/15/2017  . Psoriatic arthritis (HCC) 06/17/2017  . Essential hypertension 06/17/2017  . Hyperlipidemia associated with type 2 diabetes mellitus (HCC) 06/17/2017  . Type 2 diabetes mellitus with vascular disease (HCC) 06/17/2017  . Hair loss   . Psoriasis, unspecified 09/19/2016    Allergies  Allergen Reactions  . Metformin And Related Diarrhea    Past Surgical History:  Procedure Laterality Date  . COLONOSCOPY  05/09/2017   cleared for 10 years  . TYMPANOPLASTY     repair- 58 years old    Social History   Tobacco Use  . Smoking status: Never Smoker  . Smokeless tobacco: Never Used  Substance Use Topics  . Alcohol use: Yes    Comment: occasions  . Drug use: No     Medication list has been reviewed and updated.  Current Meds  Medication Sig  . atorvastatin (LIPITOR) 20 MG tablet TAKE 1 TABLET BY MOUTH ONCE DAILY  . folic acid (FOLVITE) 1 MG tablet Take 2 mg by mouth daily.   Marland Kitchen. lisinopril (PRINIVIL,ZESTRIL) 40 MG tablet TAKE 1 TABLET BY MOUTH ONCE DAILY  . methotrexate 2.5 MG tablet Take 20 mg by mouth once a week. Weekly on thursdays.   . sitaGLIPtin (JANUVIA) 100 MG tablet  Take 1 tablet (100 mg total) by mouth daily.    PHQ 2/9 Scores 09/16/2018 06/22/2018 03/17/2018 06/17/2017  PHQ - 2 Score 0 0 - 0  PHQ- 9 Score 0 - - -  Exception Documentation - - Medical reason -    BP Readings from Last 3 Encounters:  09/16/18 120/78  06/22/18 136/88  03/17/18 126/84    Physical Exam Vitals signs and nursing note reviewed.  Constitutional:      General: He is not in  acute distress.    Appearance: He is well-developed.  HENT:     Head: Normocephalic and atraumatic.  Neck:     Musculoskeletal: Normal range of motion and neck supple.  Cardiovascular:     Rate and Rhythm: Normal rate and regular rhythm.  Pulmonary:     Effort: Pulmonary effort is normal. No respiratory distress.     Breath sounds: No wheezing or rales.  Musculoskeletal: Normal range of motion.     Right lower leg: No edema.     Left lower leg: No edema.  Skin:    General: Skin is warm and dry.     Findings: No rash.  Neurological:     Mental Status: He is alert and oriented to person, place, and time.  Psychiatric:        Behavior: Behavior normal.        Thought Content: Thought content normal.     Wt Readings from Last 3 Encounters:  09/16/18 238 lb (108 kg)  06/22/18 242 lb (109.8 kg)  03/17/18 238 lb (108 kg)    BP 120/78   Pulse 78   Resp 16   Ht 6' (1.829 m)   Wt 238 lb (108 kg)   SpO2 98%   BMI 32.28 kg/m   Assessment and Plan: 1. Essential hypertension Controlled, continue current therapy - Basic metabolic panel  2. Type 2 diabetes mellitus with vascular disease (HCC) Schedule dilated eye exam Continue Januvia, healthy diet and weight loss efforts - Hemoglobin A1c  3. Hyperlipidemia associated with type 2 diabetes mellitus (HCC) On statin therapy   Partially dictated using Animal nutritionist. Any errors are unintentional.  Bari Edward, MD [facility]  09/16/2018

## 2018-09-16 NOTE — Patient Instructions (Addendum)
Schedule DM eye exam - dilated exam.  Ask them to send me a copy of the results.  This information is directly available on the CDC website: DiscoHelp.si.html    Source:CDC Reference to specific commercial products, manufacturers, companies, or trademarks does not constitute its endorsement or recommendation by the U.S. Government, Department of Health and [employer], or Centers for [employer] and Prevention.

## 2018-09-17 LAB — BASIC METABOLIC PANEL
BUN/Creatinine Ratio: 9 (ref 9–20)
BUN: 9 mg/dL (ref 6–24)
CO2: 21 mmol/L (ref 20–29)
Calcium: 9.5 mg/dL (ref 8.7–10.2)
Chloride: 105 mmol/L (ref 96–106)
Creatinine, Ser: 1.01 mg/dL (ref 0.76–1.27)
GFR calc Af Amer: 96 mL/min/{1.73_m2} (ref >59)
GFR calc non Af Amer: 83 mL/min/{1.73_m2} (ref >59)
Glucose: 155 mg/dL — ABNORMAL HIGH (ref 65–99)
Potassium: 4.6 mmol/L (ref 3.5–5.2)
Sodium: 140 mmol/L (ref 134–144)

## 2018-09-17 LAB — HEMOGLOBIN A1C
Est. average glucose Bld gHb Est-mCnc: 157 mg/dL
Hgb A1c MFr Bld: 7.1 % — ABNORMAL HIGH (ref 4.8–5.6)

## 2018-11-17 ENCOUNTER — Other Ambulatory Visit: Payer: Self-pay | Admitting: Internal Medicine

## 2018-11-17 DIAGNOSIS — I10 Essential (primary) hypertension: Secondary | ICD-10-CM

## 2018-11-23 DIAGNOSIS — L4059 Other psoriatic arthropathy: Secondary | ICD-10-CM | POA: Diagnosis not present

## 2018-12-24 ENCOUNTER — Other Ambulatory Visit: Payer: Self-pay | Admitting: Internal Medicine

## 2018-12-24 DIAGNOSIS — E1159 Type 2 diabetes mellitus with other circulatory complications: Secondary | ICD-10-CM

## 2019-03-21 ENCOUNTER — Encounter: Payer: BLUE CROSS/BLUE SHIELD | Admitting: Internal Medicine

## 2019-03-25 ENCOUNTER — Encounter: Payer: Self-pay | Admitting: Internal Medicine

## 2019-03-28 ENCOUNTER — Ambulatory Visit (INDEPENDENT_AMBULATORY_CARE_PROVIDER_SITE_OTHER): Payer: BC Managed Care – PPO | Admitting: Internal Medicine

## 2019-03-28 ENCOUNTER — Other Ambulatory Visit: Payer: Self-pay

## 2019-03-28 ENCOUNTER — Encounter: Payer: Self-pay | Admitting: Internal Medicine

## 2019-03-28 VITALS — BP 136/78 | HR 79 | Ht 72.0 in | Wt 237.0 lb

## 2019-03-28 DIAGNOSIS — I10 Essential (primary) hypertension: Secondary | ICD-10-CM | POA: Diagnosis not present

## 2019-03-28 DIAGNOSIS — L4050 Arthropathic psoriasis, unspecified: Secondary | ICD-10-CM

## 2019-03-28 DIAGNOSIS — Z125 Encounter for screening for malignant neoplasm of prostate: Secondary | ICD-10-CM

## 2019-03-28 DIAGNOSIS — E1169 Type 2 diabetes mellitus with other specified complication: Secondary | ICD-10-CM | POA: Diagnosis not present

## 2019-03-28 DIAGNOSIS — Z23 Encounter for immunization: Secondary | ICD-10-CM

## 2019-03-28 DIAGNOSIS — E785 Hyperlipidemia, unspecified: Secondary | ICD-10-CM | POA: Diagnosis not present

## 2019-03-28 DIAGNOSIS — E118 Type 2 diabetes mellitus with unspecified complications: Secondary | ICD-10-CM | POA: Diagnosis not present

## 2019-03-28 DIAGNOSIS — Z0000 Encounter for general adult medical examination without abnormal findings: Secondary | ICD-10-CM

## 2019-03-28 LAB — POCT URINALYSIS DIPSTICK
Bilirubin, UA: NEGATIVE
Blood, UA: NEGATIVE
Glucose, UA: NEGATIVE
Ketones, UA: NEGATIVE
Leukocytes, UA: NEGATIVE
Nitrite, UA: NEGATIVE
Protein, UA: NEGATIVE
Spec Grav, UA: >=1.03 — AB (ref 1.010–1.025)
Urobilinogen, UA: 0.2 E.U./dL
pH, UA: 6 (ref 5.0–8.0)

## 2019-03-28 MED ORDER — SITAGLIPTIN PHOSPHATE 100 MG PO TABS: 100.0000 mg | ORAL_TABLET | Freq: Every day | ORAL | 3 refills | Status: DC

## 2019-03-28 NOTE — Progress Notes (Signed)
Date:  03/28/2019   Name:  Russell Wright   DOB:  Feb 01, 1961   MRN:  [id number]   Chief Complaint: Annual Exam (PPV 23 and flu shot.) Russell DownsBradley Abril is a 58 y.o. male who presents today for his Complete Annual Exam. He feels well. He reports exercising some. He reports he is sleeping well.   Colonoscopy  05/2016 PPV-23 - none Eye exam - ?  Diabetes He presents for his follow-up diabetic visit. He has type 2 diabetes mellitus. His disease course has been stable. Pertinent negatives for hypoglycemia include no dizziness or headaches. Pertinent negatives for diabetes include no chest pain, no fatigue, no foot paresthesias and no foot ulcerations. Symptoms are stable. Current diabetic treatment includes oral agent (monotherapy) Russell Friendly(januvia). His weight is decreasing steadily. He is following a generally healthy diet. There is no compliance with monitoring of blood glucose. An ACE inhibitor/angiotensin II receptor blocker is being taken. Eye exam is not current.  Hypertension This is a chronic problem. The problem is controlled (BP at home 135/80). Pertinent negatives include no chest pain, headaches, palpitations or shortness of breath. Past treatments include ACE inhibitors. The current treatment provides significant improvement.  Hyperlipidemia The problem is controlled. Pertinent negatives include no chest pain, myalgias or shortness of breath. Current antihyperlipidemic treatment includes statins. The current treatment provides significant improvement of lipids.  PA -  Doing very well on MTX.  Lab Results  Component Value Date   HGBA1C 7.1 (H) 09/16/2018   Lab Results  Component Value Date   CREATININE 1.01 09/16/2018   BUN 9 09/16/2018   NA 140 09/16/2018   K 4.6 09/16/2018   CL 105 09/16/2018   CO2 21 09/16/2018   Lab Results  Component Value Date   CHOL 134 03/17/2018   HDL 32 (L) 03/17/2018   LDLCALC 72 03/17/2018   TRIG 149 03/17/2018   CHOLHDL 4.2 03/17/2018    Review  of Systems  Constitutional: Negative for appetite change, chills, diaphoresis, fatigue and unexpected weight change.  HENT: Negative for hearing loss, tinnitus, trouble swallowing and voice change.   Eyes: Negative for visual disturbance.  Respiratory: Negative for choking, shortness of breath and wheezing.   Cardiovascular: Negative for chest pain, palpitations and leg swelling.  Gastrointestinal: Negative for abdominal pain, blood in stool, constipation and diarrhea.  Genitourinary: Negative for difficulty urinating, dysuria and frequency.  Musculoskeletal: Negative for arthralgias, back pain and myalgias.  Skin: Negative for color change and rash.  Neurological: Negative for dizziness, syncope and headaches.  Hematological: Negative for adenopathy.  Psychiatric/Behavioral: Negative for dysphoric mood and sleep disturbance.    Patient Active Problem List   Diagnosis Date Noted  . Sleep disturbance 09/15/2017  . Psoriatic arthritis (HCC) 06/17/2017  . Essential hypertension 06/17/2017  . Hyperlipidemia associated with type 2 diabetes mellitus (HCC) 06/17/2017  . Type II diabetes mellitus with complication (HCC) 06/17/2017  . Hair loss   . Psoriasis, unspecified 09/19/2016    Allergies  Allergen Reactions  . Metformin And Related Diarrhea    Past Surgical History:  Procedure Laterality Date  . COLONOSCOPY  05/09/2017   cleared for 10 years  . TYMPANOPLASTY     repair- 58 years old    Social History   Tobacco Use  . Smoking status: Never Smoker  . Smokeless tobacco: Never Used  Substance Use Topics  . Alcohol use: Yes    Comment: occasions  . Drug use: No     Medication list has been reviewed  and updated.  Current Meds  Medication Sig  . atorvastatin (LIPITOR) 20 MG tablet TAKE 1 TABLET BY MOUTH EVERY DAY  . folic acid (FOLVITE) 1 MG tablet Take 2 mg by mouth daily.   Marland Kitchen lisinopril (ZESTRIL) 40 MG tablet TAKE 1 TABLET BY MOUTH EVERY DAY  . methotrexate 2.5 MG  tablet Take 15 mg by mouth once a week. Weekly on thursdays.   . sitaGLIPtin (JANUVIA) 100 MG tablet Take 1 tablet (100 mg total) by mouth daily.  . [DISCONTINUED] JANUVIA 100 MG tablet TAKE 1 TABLET BY MOUTH EVERY DAY    PHQ 2/9 Scores 03/28/2019 09/16/2018 06/22/2018 03/17/2018  PHQ - 2 Score 0 0 0 -  PHQ- 9 Score - 0 - -  Exception Documentation - - - Medical reason    BP Readings from Last 3 Encounters:  03/28/19 136/78  09/16/18 120/78  06/22/18 136/88    Physical Exam Vitals signs and nursing note reviewed.  Constitutional:      Appearance: Normal appearance. He is well-developed.  HENT:     Head: Normocephalic.     Right Ear: Tympanic membrane, ear canal and external ear normal.     Left Ear: Tympanic membrane, ear canal and external ear normal.     Nose: Nose normal.     Mouth/Throat:     Pharynx: Uvula midline.  Eyes:     Conjunctiva/sclera: Conjunctivae normal.     Pupils: Pupils are equal, round, and reactive to light.  Neck:     Musculoskeletal: Normal range of motion and neck supple.     Thyroid: No thyromegaly.     Vascular: No carotid bruit.  Cardiovascular:     Rate and Rhythm: Normal rate and regular rhythm.     Heart sounds: Normal heart sounds.  Pulmonary:     Effort: Pulmonary effort is normal.     Breath sounds: Normal breath sounds. No wheezing.  Chest:     Breasts:        Right: No mass.        Left: No mass.  Abdominal:     General: Bowel sounds are normal.     Palpations: Abdomen is soft.     Tenderness: There is no abdominal tenderness.  Musculoskeletal: Normal range of motion.     Right lower leg: No edema.     Left lower leg: No edema.  Lymphadenopathy:     Cervical: No cervical adenopathy.  Skin:    General: Skin is warm and dry.     Capillary Refill: Capillary refill takes less than 2 seconds.  Neurological:     General: No focal deficit present.     Mental Status: He is alert and oriented to person, place, and time.     Deep  Tendon Reflexes: Reflexes are normal and symmetric.  Psychiatric:        Speech: Speech normal.        Behavior: Behavior normal.        Thought Content: Thought content normal.        Judgment: Judgment normal.     Wt Readings from Last 3 Encounters:  03/28/19 237 lb (107.5 kg)  09/16/18 238 lb (108 kg)  06/22/18 242 lb (109.8 kg)    BP 136/78   Pulse 79   Ht 6' (1.829 m)   Wt 237 lb (107.5 kg)   SpO2 95%   BMI 32.14 kg/m   Assessment and Plan: 1. Annual physical exam Normal exam except for weight -  continue to work on diet and exercise - POCT urinalysis dipstick  2. Prostate cancer screening DRE deferred to lack of sx - PSA  3. Need for vaccination for pneumococcus - Pneumococcal polysaccharide vaccine 23-valent greater than or equal to 2yo subcutaneous/IM  4. Type II diabetes mellitus with complication (HCC) Clinically stable by exam and report without s/s of hypoglycemia. DM complicated by HTN, lipids. Tolerating medications Januvia 100 mg well without side effects or other concerns. - Comprehensive metabolic panel - Hemoglobin A1c - sitaGLIPtin (JANUVIA) 100 MG tablet; Take 1 tablet (100 mg total) by mouth daily.  Dispense: 90 tablet; Refill: 3  5. Essential hypertension Clinically stable exam with well controlled BP.   Tolerating medications, Lisinopril 40 mg without side effects at this time. Pt to continue current regimen and low sodium diet; benefits of regular exercise as able discussed. - CBC with Differential/Platelet  6. Psoriatic arthritis ([place]) Stable controlled sx on MTX; followed by Rheumatology  7. Hyperlipidemia associated with type 2 diabetes mellitus ([place]) Tolerating statin medication without side effects at this time LDL is at goal of < 70 on current dose Continue same therapy without change at this time. - Lipid panel   Partially dictated using Editor, commissioning. Any errors are unintentional.  Halina Maidens, MD [facility]  03/28/2019

## 2019-03-29 LAB — CBC WITH DIFFERENTIAL/PLATELET
Basophils Absolute: 0.1 10*3/uL (ref 0.0–0.2)
Basos: 1 %
EOS (ABSOLUTE): 0.2 10*3/uL (ref 0.0–0.4)
Eos: 2 %
Hematocrit: 46.1 % (ref 37.5–51.0)
Hemoglobin: 16.1 g/dL (ref 13.0–17.7)
Immature Grans (Abs): 0.1 10*3/uL (ref 0.0–0.1)
Immature Granulocytes: 1 %
Lymphocytes Absolute: 1.9 10*3/uL (ref 0.7–3.1)
Lymphs: 18 %
MCH: 30.6 pg (ref 26.6–33.0)
MCHC: 34.9 g/dL (ref 31.5–35.7)
MCV: 88 fL (ref 79–97)
Monocytes Absolute: 0.8 10*3/uL (ref 0.1–0.9)
Monocytes: 7 %
Neutrophils Absolute: 7.9 10*3/uL — ABNORMAL HIGH (ref 1.4–7.0)
Neutrophils: 71 %
Platelets: 321 10*3/uL (ref 150–450)
RBC: 5.26 x10E6/uL (ref 4.14–5.80)
RDW: 13.4 % (ref 11.6–15.4)
WBC: 10.9 10*3/uL — ABNORMAL HIGH (ref 3.4–10.8)

## 2019-03-29 LAB — COMPREHENSIVE METABOLIC PANEL
ALT: 35 IU/L (ref 0–44)
AST: 21 IU/L (ref 0–40)
Albumin/Globulin Ratio: 1.8 (ref 1.2–2.2)
Albumin: 4.7 g/dL (ref 3.8–4.9)
Alkaline Phosphatase: 93 IU/L (ref 39–117)
BUN/Creatinine Ratio: 14 (ref 9–20)
BUN: 13 mg/dL (ref 6–24)
Bilirubin Total: 0.2 mg/dL (ref 0.0–1.2)
CO2: 21 mmol/L (ref 20–29)
Calcium: 10.3 mg/dL — ABNORMAL HIGH (ref 8.7–10.2)
Chloride: 103 mmol/L (ref 96–106)
Creatinine, Ser: 0.96 mg/dL (ref 0.76–1.27)
GFR calc Af Amer: 102 mL/min/{1.73_m2} (ref >59)
GFR calc non Af Amer: 88 mL/min/{1.73_m2} (ref >59)
Globulin, Total: 2.6 g/dL (ref 1.5–4.5)
Glucose: 180 mg/dL — ABNORMAL HIGH (ref 65–99)
Potassium: 4.8 mmol/L (ref 3.5–5.2)
Sodium: 141 mmol/L (ref 134–144)
Total Protein: 7.3 g/dL (ref 6.0–8.5)

## 2019-03-29 LAB — LIPID PANEL
Chol/HDL Ratio: 5.1 ratio — ABNORMAL HIGH (ref 0.0–5.0)
Cholesterol, Total: 174 mg/dL (ref 100–199)
HDL: 34 mg/dL — ABNORMAL LOW (ref >39)
LDL Chol Calc (NIH): 98 mg/dL (ref 0–99)
Triglycerides: 249 mg/dL — ABNORMAL HIGH (ref 0–149)
VLDL Cholesterol Cal: 42 mg/dL — ABNORMAL HIGH (ref 5–40)

## 2019-03-29 LAB — HEMOGLOBIN A1C
Est. average glucose Bld gHb Est-mCnc: 157 mg/dL
Hgb A1c MFr Bld: 7.1 % — ABNORMAL HIGH (ref 4.8–5.6)

## 2019-03-29 LAB — PSA: Prostate Specific Ag, Serum: 0.8 ng/mL (ref 0.0–4.0)

## 2019-05-29 ENCOUNTER — Ambulatory Visit
Admission: EM | Admit: 2019-05-29 | Discharge: 2019-05-29 | Disposition: A | Discharge status: Home / Self Care | Payer: BC Managed Care – PPO | Attending: Family Medicine | Admitting: Family Medicine

## 2019-05-29 ENCOUNTER — Encounter: Payer: Self-pay | Admitting: Emergency Medicine

## 2019-05-29 ENCOUNTER — Other Ambulatory Visit: Payer: Self-pay

## 2019-05-29 DIAGNOSIS — R519 Headache, unspecified: Secondary | ICD-10-CM | POA: Diagnosis not present

## 2019-05-29 DIAGNOSIS — B349 Viral infection, unspecified: Secondary | ICD-10-CM

## 2019-05-29 DIAGNOSIS — R509 Fever, unspecified: Secondary | ICD-10-CM | POA: Diagnosis not present

## 2019-05-29 HISTORY — DX: Type 2 diabetes mellitus without complications: E11.9

## 2019-05-29 NOTE — ED Provider Notes (Signed)
[facility]    CSN: [id number] Arrival date & time: 05/29/19  1237      History   Chief Complaint Chief Complaint  Patient presents with  . Fever  . Headache    HPI Russell Wright is a 58 y.o. male.   58 yo male with a c/o headache, body aches, fevers since yesterday. States possible positive covid exposure.    Fever Associated symptoms: headaches   Headache Associated symptoms: fever     Past Medical History:  Diagnosis Date  . Diabetes mellitus without complication ([place])   . Hair loss    takes folic acid  . Hyperlipidemia   . Hypertension   . Psoriatic arthritis ([place])   . Psoriatic arthritis [facility])     Patient Active Problem List   Diagnosis Date Noted  . Sleep disturbance 09/15/2017  . Psoriatic arthritis ([place]) 06/17/2017  . Essential hypertension 06/17/2017  . Hyperlipidemia associated with type 2 diabetes mellitus ([place]) 06/17/2017  . Type II diabetes mellitus with complication ([place]) 15/40/0867  . Hair loss   . Psoriasis, unspecified 09/19/2016    Past Surgical History:  Procedure Laterality Date  . COLONOSCOPY  05/09/2017   cleared for 10 years  . TYMPANOPLASTY     repair- 58 years old       Home Medications    Prior to Admission medications   Medication Sig Start Date End Date Taking? Authorizing Provider  atorvastatin (LIPITOR) 20 MG tablet TAKE 1 TABLET BY MOUTH EVERY DAY 11/17/18  Yes Glean Hess, MD  folic acid (FOLVITE) 1 MG tablet Take 2 mg by mouth daily.    Yes [provider]  lisinopril (ZESTRIL) 40 MG tablet TAKE 1 TABLET BY MOUTH EVERY DAY 11/17/18  Yes Glean Hess, MD  methotrexate 2.5 MG tablet Take 15 mg by mouth once a week. Weekly on thursdays.    Yes [provider]  sitaGLIPtin (JANUVIA) 100 MG tablet Take 1 tablet (100 mg total) by mouth daily. 03/28/19  Yes Glean Hess, MD    Family History Family History  Problem Relation Age of Onset  . Other Mother        unknown  medical history  . Diabetes Father   . Hypertension Father   . Hyperlipidemia Father   . Cancer Paternal Grandfather     Social History Social History   Tobacco Use  . Smoking status: Never Smoker  . Smokeless tobacco: Never Used  Substance Use Topics  . Alcohol use: Yes    Comment: rarely  . Drug use: No     Allergies   Metformin and related   Review of Systems Review of Systems  Constitutional: Positive for fever.  Neurological: Positive for headaches.     Physical Exam Triage Vital Signs ED Triage Vitals  Enc Vitals Group     BP 05/29/19 1253 (!) 155/103     Pulse Rate 05/29/19 1253 (!) 111     Resp 05/29/19 1253 18     Temp 05/29/19 1253 100.1 F (37.8 C)     Temp Source 05/29/19 1253 Oral     SpO2 05/29/19 1253 96 %     Weight 05/29/19 1256 235 lb (106.6 kg)     Height 05/29/19 1256 6' (1.829 m)     Head Circumference --      Peak Flow --      Pain Score 05/29/19 1256 2     Pain Loc --      Pain  Edu? --      Excl. in GC? --    No data found.  Updated Vital Signs BP (!) 155/103 (BP Location: Left Arm)   Pulse (!) 111   Temp 100.1 F (37.8 C) (Oral)   Resp 18   Ht 6' (1.829 m)   Wt 106.6 kg   SpO2 96%   BMI 31.87 kg/m   Visual Acuity Right Eye Distance:   Left Eye Distance:   Bilateral Distance:    Right Eye Near:   Left Eye Near:    Bilateral Near:     Physical Exam Vitals and nursing note reviewed.  Constitutional:      General: He is not in acute distress.    Appearance: He is not toxic-appearing or diaphoretic.  Cardiovascular:     Rate and Rhythm: Tachycardia present.  Pulmonary:     Effort: Pulmonary effort is normal.     Breath sounds: Normal breath sounds.  Neurological:     Mental Status: He is alert.      UC Treatments / Results  Labs (all labs ordered are listed, but only abnormal results are displayed) Labs Reviewed  NOVEL CORONAVIRUS, NAA (HOSP ORDER, SEND-OUT TO REF LAB; TAT 18-24 HRS)     EKG   Radiology No results found.  Procedures Procedures (including critical care time)  Medications Ordered in UC Medications - No data to display  Initial Impression / Assessment and Plan / UC Course  I have reviewed the triage vital signs and the nursing notes.  Pertinent labs & imaging results that were available during my care of the patient were reviewed by me and considered in my medical decision making (see chart for details).      Final Clinical Impressions(s) / UC Diagnoses   Final diagnoses:  Viral illness     Discharge Instructions     Rest, fluids, over the counter medications as needed Self isolate Await covid test result    ED Prescriptions    None     1. diagnosis reviewed with patient 2. Recommend supportive treatment as above 3. covid test done  4. Follow-up prn if symptoms worsen or don't improve  PDMP not reviewed this encounter.   Payton Mccallum, MD 05/29/19 [phone]

## 2019-05-29 NOTE — Discharge Instructions (Signed)
Rest, fluids, over the counter medications as needed Self isolate Await covid test result

## 2019-05-29 NOTE — ED Triage Notes (Signed)
Patient in today c/o headache and fever (highest 101.2) since yesterday.

## 2019-05-30 LAB — NOVEL CORONAVIRUS, NAA (HOSP ORDER, SEND-OUT TO REF LAB; TAT 18-24 HRS): SARS-CoV-2, NAA: DETECTED — AB

## 2019-05-31 ENCOUNTER — Telehealth: Payer: Self-pay | Admitting: Infectious Diseases

## 2019-05-31 ENCOUNTER — Telehealth: Payer: Self-pay | Admitting: Emergency Medicine

## 2019-05-31 NOTE — Telephone Encounter (Signed)

## 2019-05-31 NOTE — Telephone Encounter (Signed)
Called to discuss with patient about Covid symptoms and the use of bamlanivimab, a monoclonal antibody infusion for those with mild to moderate Covid symptoms and at a high risk of hospitalization.  Pt is qualified for this infusion at the [facility] infusion center due to Age >55, Diabetes and Hypertension   Message left to call back. MyChart also sent.    Symptom onset 12/19 per UC records.

## 2019-06-05 ENCOUNTER — Other Ambulatory Visit: Payer: Self-pay

## 2019-06-05 ENCOUNTER — Emergency Department: Payer: BC Managed Care – PPO

## 2019-06-05 ENCOUNTER — Emergency Department
Admission: EM | Admit: 2019-06-05 | Discharge: 2019-06-05 | Disposition: A | Discharge status: Home / Self Care | Payer: BC Managed Care – PPO | Attending: Emergency Medicine | Admitting: Emergency Medicine

## 2019-06-05 ENCOUNTER — Encounter: Payer: Self-pay | Admitting: Emergency Medicine

## 2019-06-05 DIAGNOSIS — R05 Cough: Secondary | ICD-10-CM | POA: Diagnosis not present

## 2019-06-05 DIAGNOSIS — E119 Type 2 diabetes mellitus without complications: Secondary | ICD-10-CM | POA: Diagnosis not present

## 2019-06-05 DIAGNOSIS — R197 Diarrhea, unspecified: Secondary | ICD-10-CM | POA: Diagnosis not present

## 2019-06-05 DIAGNOSIS — E860 Dehydration: Secondary | ICD-10-CM | POA: Diagnosis not present

## 2019-06-05 DIAGNOSIS — I10 Essential (primary) hypertension: Secondary | ICD-10-CM | POA: Insufficient documentation

## 2019-06-05 DIAGNOSIS — Z7984 Long term (current) use of oral hypoglycemic drugs: Secondary | ICD-10-CM | POA: Diagnosis not present

## 2019-06-05 DIAGNOSIS — U071 COVID-19: Secondary | ICD-10-CM

## 2019-06-05 DIAGNOSIS — Z79899 Other long term (current) drug therapy: Secondary | ICD-10-CM | POA: Diagnosis not present

## 2019-06-05 DIAGNOSIS — J189 Pneumonia, unspecified organism: Secondary | ICD-10-CM | POA: Diagnosis not present

## 2019-06-05 LAB — COMPREHENSIVE METABOLIC PANEL
ALT: 44 U/L (ref 0–44)
AST: 55 U/L — ABNORMAL HIGH (ref 15–41)
Albumin: 3.9 g/dL (ref 3.5–5.0)
Alkaline Phosphatase: 67 U/L (ref 38–126)
Anion gap: 12 (ref 5–15)
BUN: 14 mg/dL (ref 6–20)
CO2: 22 mmol/L (ref 22–32)
Calcium: 8.3 mg/dL — ABNORMAL LOW (ref 8.9–10.3)
Chloride: 100 mmol/L (ref 98–111)
Creatinine, Ser: 0.87 mg/dL (ref 0.61–1.24)
GFR calc Af Amer: >60 mL/min (ref >60)
GFR calc non Af Amer: >60 mL/min (ref >60)
Glucose, Bld: 206 mg/dL — ABNORMAL HIGH (ref 70–99)
Potassium: 3.5 mmol/L (ref 3.5–5.1)
Sodium: 134 mmol/L — ABNORMAL LOW (ref 135–145)
Total Bilirubin: 0.5 mg/dL (ref 0.3–1.2)
Total Protein: 7.5 g/dL (ref 6.5–8.1)

## 2019-06-05 LAB — LIPASE, BLOOD: Lipase: 32 U/L (ref 11–51)

## 2019-06-05 LAB — CBC
HCT: 43.9 % (ref 39.0–52.0)
Hemoglobin: 15.8 g/dL (ref 13.0–17.0)
MCH: 30 pg (ref 26.0–34.0)
MCHC: 36 g/dL (ref 30.0–36.0)
MCV: 83.3 fL (ref 80.0–100.0)
Platelets: 174 10*3/uL (ref 150–400)
RBC: 5.27 MIL/uL (ref 4.22–5.81)
RDW: 13.6 % (ref 11.5–15.5)
WBC: 6.2 10*3/uL (ref 4.0–10.5)
nRBC: 0 % (ref 0.0–0.2)

## 2019-06-05 MED ORDER — ACETAMINOPHEN 500 MG PO TABS
1000.0000 mg | ORAL_TABLET | Freq: Once | ORAL | Status: AC
  Administered 2019-06-05: 1000 mg via ORAL
  Filled 2019-06-05: qty 2

## 2019-06-05 MED ORDER — SODIUM CHLORIDE 0.9 % IV BOLUS
1000.0000 mL | Freq: Once | INTRAVENOUS | Status: AC
  Administered 2019-06-05: 1000 mL via INTRAVENOUS

## 2019-06-05 MED ORDER — GUAIFENESIN-CODEINE 100-10 MG/5ML PO SOLN: 5.0000 mL | Freq: Four times a day (QID) | ORAL | 0 refills | Status: DC | PRN

## 2019-06-05 MED ORDER — METHYLPREDNISOLONE SODIUM SUCC 125 MG IJ SOLR
125.0000 mg | Freq: Once | INTRAMUSCULAR | Status: AC
  Administered 2019-06-05: 125 mg via INTRAVENOUS
  Filled 2019-06-05: qty 2

## 2019-06-05 MED ORDER — ONDANSETRON 4 MG PO TBDP: 4.0000 mg | ORAL_TABLET | Freq: Three times a day (TID) | ORAL | 0 refills | Status: DC | PRN

## 2019-06-05 MED ORDER — HYDROCOD POLST-CPM POLST ER 10-8 MG/5ML PO SUER
5.0000 mL | Freq: Once | ORAL | Status: AC
  Administered 2019-06-05: 5 mL via ORAL
  Filled 2019-06-05: qty 5

## 2019-06-05 MED ORDER — PREDNISONE 20 MG PO TABS: 40.0000 mg | ORAL_TABLET | Freq: Every day | ORAL | 0 refills | Status: DC

## 2019-06-05 MED ORDER — ONDANSETRON HCL 4 MG/2ML IJ SOLN
4.0000 mg | Freq: Once | INTRAMUSCULAR | Status: AC
  Administered 2019-06-05: 4 mg via INTRAVENOUS
  Filled 2019-06-05: qty 2

## 2019-06-05 MED ORDER — SODIUM CHLORIDE 0.9% FLUSH: 3.0000 mL | Freq: Once | INTRAVENOUS | Status: DC

## 2019-06-05 NOTE — ED Triage Notes (Addendum)
C/O cough, nausea, and diarrhea.  Diarrhea x 3 days.  AAOx3.  Skin warm and dry. NAD

## 2019-06-05 NOTE — ED Notes (Signed)
Pt verbalized understanding of discharge instructions. NAD at this time. 

## 2019-06-05 NOTE — ED Provider Notes (Signed)
[facility] Emergency Department Provider Note  Time seen: 10:04 AM  I have reviewed the triage vital signs and the nursing notes.   HISTORY  Chief Complaint Cough and Diarrhea   HPI Russell Wright is a 58 y.o. male with a past medical history of diabetes, hypertension, hyperlipidemia, recently diagnosed coronavirus presents to the emergency department  for generalized fatigue weakness continued Covid symptoms and possible dehydration.  According to the patient this is approximately 8 or 9 of symptoms per patient.  Continues to have intermittent fevers continues to have cough.  States over the past 3 to 4 days he has developed diarrhea as well and feels weak and dehydrated today.  Patient had been taking cough medication with minimal relief.  Denies any chest pain.  States mild upper abdominal pain when coughing only.  Past Medical History:  Diagnosis Date  . Diabetes mellitus without complication (HCC)   . Hair loss    takes folic acid  . Hyperlipidemia   . Hypertension   . Psoriatic arthritis (HCC)   . Psoriatic arthritis [facility])     Patient Active Problem List   Diagnosis Date Noted  . Sleep disturbance 09/15/2017  . Psoriatic arthritis (HCC) 06/17/2017  . Essential hypertension 06/17/2017  . Hyperlipidemia associated with type 2 diabetes mellitus (HCC) 06/17/2017  . Type II diabetes mellitus with complication (HCC) 06/17/2017  . Hair loss   . Psoriasis, unspecified 09/19/2016    Past Surgical History:  Procedure Laterality Date  . COLONOSCOPY  05/09/2017   cleared for 10 years  . TYMPANOPLASTY     repair- 58 years old    Prior to Admission medications   Medication Sig Start Date End Date Taking? Authorizing Provider  atorvastatin (LIPITOR) 20 MG tablet TAKE 1 TABLET BY MOUTH EVERY DAY 11/17/18   Reubin Milan, MD  folic acid (FOLVITE) 1 MG tablet Take 2 mg by mouth daily.     [provider]  lisinopril (ZESTRIL) 40 MG tablet TAKE 1  TABLET BY MOUTH EVERY DAY 11/17/18   Reubin Milan, MD  methotrexate 2.5 MG tablet Take 15 mg by mouth once a week. Weekly on thursdays.     [provider]  sitaGLIPtin (JANUVIA) 100 MG tablet Take 1 tablet (100 mg total) by mouth daily. 03/28/19   Reubin Milan, MD    Allergies  Allergen Reactions  . Metformin And Related Diarrhea    Family History  Problem Relation Age of Onset  . Other Mother        unknown medical history  . Diabetes Father   . Hypertension Father   . Hyperlipidemia Father   . Cancer Paternal Grandfather     Social History Social History   Tobacco Use  . Smoking status: Never Smoker  . Smokeless tobacco: Never Used  Substance Use Topics  . Alcohol use: Yes    Comment: rarely  . Drug use: No    Review of Systems Constitutional: Continued fevers ENT: Mild congestion Cardiovascular: Negative for chest pain. Respiratory: Mild shortness of breath.  Positive cough. Gastrointestinal: Mild upper abdominal pain with coughing.  Positive for nausea but negative for vomiting.  Positive for diarrhea x3 days. Genitourinary: Negative for urinary compaints Musculoskeletal: Negative for musculoskeletal complaints Neurological: Negative for headache All other ROS negative  ____________________________________________   PHYSICAL EXAM:  VITAL SIGNS: ED Triage Vitals  Enc Vitals Group     BP 06/05/19 0945 138/90     Pulse Rate 06/05/19 0945 Marland Kitchen)  111     Resp 06/05/19 0945 18     Temp 06/05/19 0945 100 F (37.8 C)     Temp Source 06/05/19 0945 Oral     SpO2 06/05/19 0945 96 %     Weight 06/05/19 0941 235 lb 0.2 oz (106.6 kg)     Height --      Head Circumference --      Peak Flow --      Pain Score 06/05/19 0941 0     Pain Loc --      Pain Edu? --      Excl. in [place]? --    Constitutional: Alert and oriented. Well appearing and in no distress. Eyes: Normal exam ENT      Head: Normocephalic and atraumatic.      Mouth/Throat: Mucous  membranes are moist. Cardiovascular: Normal rate, regular rhythm.  Respiratory: Normal respiratory effort without tachypnea nor retractions. Breath sounds are clear  Gastrointestinal: Soft and nontender. No distention.   Musculoskeletal: Nontender with normal range of motion in all extremities.  Neurologic:  Normal speech and language. No gross focal neurologic deficits Skin:  Skin is warm, dry and intact.  Psychiatric: Mood and affect are normal.   ____________________________________________   RADIOLOGY  Chest x-ray shows multifocal pneumonia.  ____________________________________________   INITIAL IMPRESSION / ASSESSMENT AND PLAN / ED COURSE  Pertinent labs & imaging results that were available during my care of the patient were reviewed by me and considered in my medical decision making (see chart for details).   Patient presents to the emergency department for continued fatigue weakness now with diarrhea nausea with continued cough and shortness of breath.  Patient was diagnosed with Covid 05/29/2019, day 8 or 9 of symptoms per patient.  We will check labs, chest x-ray, treat nausea, IV hydrate and dose cough medication.  Patient agreeable to plan of care.  Reassuringly patient does have a 96% room air saturation currently.  Chest x-ray appears to be consistent with viral pneumonia/Covid.  Lab work shows a normal white blood cell count again indicating likely viral pneumonia.  Patient states he feels much better after medication and fluids.  We will discharge the patient home with nausea medication, cough medication we will start on steroids.  Discussed my typical Covid return precautions.  Russell Wright was evaluated in Emergency Department on 06/05/2019 for the symptoms described in the history of present illness. He was evaluated in the context of the global COVID-19 pandemic, which necessitated consideration that the patient might be at risk for infection with the SARS-CoV-2 virus  that causes COVID-19. Institutional protocols and algorithms that pertain to the evaluation of patients at risk for COVID-19 are in a state of rapid change based on information released by regulatory bodies including the CDC and federal and state organizations. These policies and algorithms were followed during the patient's care in the ED.  ____________________________________________   FINAL CLINICAL IMPRESSION(S) / ED DIAGNOSES  COVID-19   Harvest Dark, MD 06/05/19 1406

## 2019-06-07 ENCOUNTER — Telehealth: Payer: Self-pay

## 2019-06-07 NOTE — Telephone Encounter (Signed)
Wife, Butch Penny, called wanting to know if there was something else Army Melia could give pt for cough. He did test positive for covid and has pneumonia as well. After reviewing the chart, he was prescribed Rob AC and was also given prednisone. Unfortunately [facility] cannot prescribe anything different from what he is already taking. I LEFT A MESSAGE stating this as well as the cough may be the last thing to go. There is not much else she can do.

## 2019-06-08 ENCOUNTER — Other Ambulatory Visit: Payer: Self-pay | Admitting: Internal Medicine

## 2019-06-08 DIAGNOSIS — I10 Essential (primary) hypertension: Secondary | ICD-10-CM

## 2019-06-28 ENCOUNTER — Ambulatory Visit: Payer: BC Managed Care – PPO | Admitting: Internal Medicine

## 2019-06-28 ENCOUNTER — Ambulatory Visit
Admission: RE | Admit: 2019-06-28 | Discharge: 2019-06-28 | Disposition: A | Discharge status: Home / Self Care | Payer: BC Managed Care – PPO | Attending: Internal Medicine | Admitting: Internal Medicine

## 2019-06-28 ENCOUNTER — Ambulatory Visit
Admission: RE | Admit: 2019-06-28 | Discharge: 2019-06-28 | Disposition: A | Discharge status: Home / Self Care | Payer: BC Managed Care – PPO | Source: Ambulatory Visit | Attending: Internal Medicine | Admitting: Internal Medicine

## 2019-06-28 ENCOUNTER — Encounter: Payer: Self-pay | Admitting: Internal Medicine

## 2019-06-28 ENCOUNTER — Other Ambulatory Visit
Admission: RE | Admit: 2019-06-28 | Discharge: 2019-06-28 | Disposition: A | Discharge status: Home / Self Care | Payer: BC Managed Care – PPO | Source: Home / Self Care | Attending: Internal Medicine | Admitting: Internal Medicine

## 2019-06-28 ENCOUNTER — Other Ambulatory Visit: Payer: Self-pay

## 2019-06-28 VITALS — BP 138/86 | HR 95 | Temp 98.0°F | Ht 72.0 in | Wt 227.0 lb

## 2019-06-28 DIAGNOSIS — J1282 Pneumonia due to coronavirus disease 2019: Secondary | ICD-10-CM

## 2019-06-28 DIAGNOSIS — E871 Hypo-osmolality and hyponatremia: Secondary | ICD-10-CM | POA: Diagnosis not present

## 2019-06-28 DIAGNOSIS — U071 COVID-19: Secondary | ICD-10-CM | POA: Diagnosis not present

## 2019-06-28 DIAGNOSIS — J129 Viral pneumonia, unspecified: Secondary | ICD-10-CM | POA: Diagnosis not present

## 2019-06-28 LAB — BASIC METABOLIC PANEL
Anion gap: 8 (ref 5–15)
BUN: 15 mg/dL (ref 6–20)
CO2: 22 mmol/L (ref 22–32)
Calcium: 9.3 mg/dL (ref 8.9–10.3)
Chloride: 103 mmol/L (ref 98–111)
Creatinine, Ser: 0.87 mg/dL (ref 0.61–1.24)
GFR calc Af Amer: >60 mL/min (ref >60)
GFR calc non Af Amer: >60 mL/min (ref >60)
Glucose, Bld: 140 mg/dL — ABNORMAL HIGH (ref 70–99)
Potassium: 4 mmol/L (ref 3.5–5.1)
Sodium: 133 mmol/L — ABNORMAL LOW (ref 135–145)

## 2019-06-28 NOTE — Progress Notes (Signed)
Date:  06/28/2019   Name:  Russell Wright   DOB:  Nov 16, 1960   MRN:  [id number]   Chief Complaint: Hospitalization Follow-up (Pt f/u from hosppital w/ pnuemonia and COVID. Gets SOB when wearing mask at end of the day but feeling better. ) Patient is here for follow up.  He tested positive for Covid on 05/29/2019.  He was in the ED on 06/05/2019 with ongoing cough and fever.  CXR was c/w viral/Covid pneumonia.  He was treated with prednisone and cough medication.  He was instructed to follow up.  It has been 4+ weeks since his initial diagnosis. He is back to work and doing well.  He gets slightly short of breath at the end of the day, exacerbated by wearing a mask.  The cough has resolved.  No further fevers.  HPI  Lab Results  Component Value Date   WBC 6.2 06/05/2019   HGB 15.8 06/05/2019   HCT 43.9 06/05/2019   MCV 83.3 06/05/2019   PLT 174 06/05/2019    Lab Results  Component Value Date   CREATININE 0.87 06/05/2019   BUN 14 06/05/2019   NA 134 (L) 06/05/2019   K 3.5 06/05/2019   CL 100 06/05/2019   CO2 22 06/05/2019   Lab Results  Component Value Date   CHOL 174 03/28/2019   HDL 34 (L) 03/28/2019   LDLCALC 98 03/28/2019   TRIG 249 (H) 03/28/2019   CHOLHDL 5.1 (H) 03/28/2019   No results found for: TSH Lab Results  Component Value Date   HGBA1C 7.1 (H) 03/28/2019     Review of Systems  Constitutional: Negative for chills, fatigue and fever.  HENT: Negative for trouble swallowing.   Respiratory: Positive for shortness of breath (at the end of the day). Negative for cough, chest tightness and wheezing.   Cardiovascular: Negative for chest pain, palpitations and leg swelling.  Neurological: Negative for dizziness, light-headedness and headaches.  Psychiatric/Behavioral: Negative for dysphoric mood and sleep disturbance. The patient is not nervous/anxious.     Patient Active Problem List   Diagnosis Date Noted  . Sleep disturbance 09/15/2017  . Psoriatic  arthritis ([place]) 06/17/2017  . Essential hypertension 06/17/2017  . Hyperlipidemia associated with type 2 diabetes mellitus ([place]) 06/17/2017  . Type II diabetes mellitus with complication ([place]) 17/51/0258  . Hair loss   . Psoriasis, unspecified 09/19/2016    Allergies  Allergen Reactions  . Metformin And Related Diarrhea    Past Surgical History:  Procedure Laterality Date  . COLONOSCOPY  05/09/2017   cleared for 10 years  . TYMPANOPLASTY     repair- 59 years old    Social History   Tobacco Use  . Smoking status: Never Smoker  . Smokeless tobacco: Never Used  Substance Use Topics  . Alcohol use: Yes    Comment: rarely  . Drug use: No     Medication list has been reviewed and updated.  Current Meds  Medication Sig  . atorvastatin (LIPITOR) 20 MG tablet TAKE 1 TABLET BY MOUTH ONCE DAILY  . folic acid (FOLVITE) 1 MG tablet Take 2 mg by mouth daily.   Marland Kitchen lisinopril (ZESTRIL) 40 MG tablet TAKE 1 TABLET BY MOUTH ONCE DAILY  . methotrexate 2.5 MG tablet Take 15 mg by mouth once a week. Weekly on thursdays.   . sitaGLIPtin (JANUVIA) 100 MG tablet Take 1 tablet (100 mg total) by mouth daily.    PHQ 2/9 Scores 06/28/2019 03/28/2019 09/16/2018 06/22/2018  PHQ -  2 Score 0 0 0 0  PHQ- 9 Score 0 - 0 -  Exception Documentation - - - -    BP Readings from Last 3 Encounters:  06/28/19 138/86  06/05/19 (!) 133/92  05/29/19 (!) 155/103    Physical Exam Vitals and nursing note reviewed.  Constitutional:      General: He is not in acute distress.    Appearance: Normal appearance. He is well-developed.  HENT:     Head: Normocephalic and atraumatic.  Neck:     Vascular: No carotid bruit.  Cardiovascular:     Rate and Rhythm: Normal rate and regular rhythm.     Pulses: Normal pulses.  Pulmonary:     Effort: Pulmonary effort is normal. No respiratory distress.     Breath sounds: Normal breath sounds. No wheezing or rhonchi.  Musculoskeletal:        General: Normal range of  motion.     Cervical back: Normal range of motion.     Right lower leg: No edema.     Left lower leg: No edema.  Lymphadenopathy:     Cervical: No cervical adenopathy.  Skin:    General: Skin is warm and dry.     Findings: No rash.  Neurological:     Mental Status: He is alert and oriented to person, place, and time.  Psychiatric:        Behavior: Behavior normal.        Thought Content: Thought content normal.     Wt Readings from Last 3 Encounters:  06/28/19 227 lb (103 kg)  06/05/19 235 lb 0.2 oz (106.6 kg)  05/29/19 235 lb (106.6 kg)    BP 138/86   Pulse 95   Temp 98 F (36.7 C) (Oral)   Ht 6' (1.829 m)   Wt 227 lb (103 kg)   SpO2 96%   BMI 30.79 kg/m   Assessment and Plan: 1. Pneumonia due to COVID-19 virus Clinically much improved Will repeat CXR to demonstrate resolution Residual SOB should slowly improved - DG Chest 2 View; Future  2. Hyponatremia Will repeat labs to document resolution - Basic metabolic panel   Partially dictated using Animal nutritionist. Any errors are unintentional.  Bari Edward, MD [facility]  06/28/2019

## 2019-07-11 DIAGNOSIS — L4059 Other psoriatic arthropathy: Secondary | ICD-10-CM | POA: Diagnosis not present

## 2019-07-11 DIAGNOSIS — Z79899 Other long term (current) drug therapy: Secondary | ICD-10-CM | POA: Diagnosis not present

## 2019-07-14 DIAGNOSIS — Z79899 Other long term (current) drug therapy: Secondary | ICD-10-CM | POA: Diagnosis not present

## 2019-08-03 ENCOUNTER — Encounter: Payer: Self-pay | Admitting: Internal Medicine

## 2019-08-03 ENCOUNTER — Other Ambulatory Visit: Payer: Self-pay

## 2019-08-03 ENCOUNTER — Other Ambulatory Visit
Admission: RE | Admit: 2019-08-03 | Discharge: 2019-08-03 | Disposition: A | Discharge status: Home / Self Care | Payer: BC Managed Care – PPO | Attending: Internal Medicine | Admitting: Internal Medicine

## 2019-08-03 ENCOUNTER — Ambulatory Visit: Payer: BC Managed Care – PPO | Admitting: Internal Medicine

## 2019-08-03 VITALS — BP 124/84 | HR 77 | Temp 95.6°F | Ht 72.0 in | Wt 227.0 lb

## 2019-08-03 DIAGNOSIS — L4050 Arthropathic psoriasis, unspecified: Secondary | ICD-10-CM | POA: Diagnosis not present

## 2019-08-03 DIAGNOSIS — E118 Type 2 diabetes mellitus with unspecified complications: Secondary | ICD-10-CM

## 2019-08-03 DIAGNOSIS — E1169 Type 2 diabetes mellitus with other specified complication: Secondary | ICD-10-CM

## 2019-08-03 DIAGNOSIS — I10 Essential (primary) hypertension: Secondary | ICD-10-CM | POA: Diagnosis not present

## 2019-08-03 DIAGNOSIS — E785 Hyperlipidemia, unspecified: Secondary | ICD-10-CM

## 2019-08-03 LAB — HEMOGLOBIN A1C
Hgb A1c MFr Bld: 7 % — ABNORMAL HIGH (ref 4.8–5.6)
Mean Plasma Glucose: 154.2 mg/dL

## 2019-08-03 NOTE — Progress Notes (Signed)
Date:  08/03/2019   Name:  Russell Wright   DOB:  [birth year]   MRN:  [id number]   Chief Complaint: Diabetes (4 month follow up. Having eye exam 08/24/19.) and Hypertension  Diabetes He presents for his follow-up diabetic visit. He has type 2 diabetes mellitus. Pertinent negatives for hypoglycemia include no headaches or tremors. Pertinent negatives for diabetes include no chest pain, no fatigue, no polydipsia and no polyuria. Current diabetic treatment includes oral agent (monotherapy) Alma Friendly). He is compliant with treatment all of the time.  Hypertension This is a chronic problem. The problem is controlled. Pertinent negatives include no chest pain, headaches, palpitations or shortness of breath. Past treatments include ACE inhibitors. The current treatment provides significant improvement. There are no compliance problems.   Hyperlipidemia This is a chronic problem. The problem is controlled. Pertinent negatives include no chest pain or shortness of breath. Current antihyperlipidemic treatment includes statins. The current treatment provides significant improvement of lipids.    Lab Results  Component Value Date   CREATININE 0.87 06/28/2019   BUN 15 06/28/2019   NA 133 (L) 06/28/2019   K 4.0 06/28/2019   CL 103 06/28/2019   CO2 22 06/28/2019   Lab Results  Component Value Date   CHOL 174 03/28/2019   HDL 34 (L) 03/28/2019   LDLCALC 98 03/28/2019   TRIG 249 (H) 03/28/2019   CHOLHDL 5.1 (H) 03/28/2019   No results found for: TSH Lab Results  Component Value Date   HGBA1C 7.1 (H) 03/28/2019     Review of Systems  Constitutional: Negative for appetite change, fatigue and unexpected weight change.  Eyes: Negative for visual disturbance.  Respiratory: Negative for cough, shortness of breath and wheezing.   Cardiovascular: Negative for chest pain, palpitations and leg swelling.  Gastrointestinal: Negative for abdominal pain and blood in stool.  Endocrine: Negative for  polydipsia and polyuria.  Genitourinary: Negative for dysuria and hematuria.  Skin: Negative for color change and rash.  Neurological: Negative for tremors, numbness and headaches.  Psychiatric/Behavioral: Negative for dysphoric mood.    Patient Active Problem List   Diagnosis Date Noted  . Sleep disturbance 09/15/2017  . Psoriatic arthritis (HCC) 06/17/2017  . Essential hypertension 06/17/2017  . Hyperlipidemia associated with type 2 diabetes mellitus (HCC) 06/17/2017  . Type II diabetes mellitus with complication (HCC) 06/17/2017  . Hair loss   . Psoriasis, unspecified 09/19/2016    Allergies  Allergen Reactions  . Metformin And Related Diarrhea    Past Surgical History:  Procedure Laterality Date  . COLONOSCOPY  05/09/2017   cleared for 10 years  . TYMPANOPLASTY     repair- 59 years old    Social History   Tobacco Use  . Smoking status: Never Smoker  . Smokeless tobacco: Never Used  Substance Use Topics  . Alcohol use: Yes    Comment: rarely  . Drug use: No     Medication list has been reviewed and updated.  Current Meds  Medication Sig  . atorvastatin (LIPITOR) 20 MG tablet TAKE 1 TABLET BY MOUTH ONCE DAILY  . folic acid (FOLVITE) 1 MG tablet Take 2 mg by mouth daily.   Marland Kitchen lisinopril (ZESTRIL) 40 MG tablet TAKE 1 TABLET BY MOUTH ONCE DAILY  . methotrexate 2.5 MG tablet Take 15 mg by mouth once a week. Weekly on thursdays.   . sitaGLIPtin (JANUVIA) 100 MG tablet Take 1 tablet (100 mg total) by mouth daily.    PHQ 2/9 Scores 08/03/2019 06/28/2019 03/28/2019  09/16/2018  PHQ - 2 Score 0 0 0 0  PHQ- 9 Score 0 0 - 0  Exception Documentation - - - -    BP Readings from Last 3 Encounters:  08/03/19 124/84  06/28/19 138/86  06/05/19 (!) 133/92    Physical Exam Vitals and nursing note reviewed.  Constitutional:      General: He is not in acute distress.    Appearance: Normal appearance. He is well-developed.  HENT:     Head: Normocephalic and atraumatic.    Cardiovascular:     Rate and Rhythm: Normal rate and regular rhythm.     Pulses: Normal pulses.  Pulmonary:     Effort: Pulmonary effort is normal. No respiratory distress.  Musculoskeletal:     Cervical back: Normal range of motion.     Right lower leg: No edema.     Left lower leg: No edema.  Lymphadenopathy:     Cervical: No cervical adenopathy.  Skin:    General: Skin is warm and dry.     Findings: No rash.  Neurological:     Mental Status: He is alert and oriented to person, place, and time.  Psychiatric:        Behavior: Behavior normal.        Thought Content: Thought content normal.     Wt Readings from Last 3 Encounters:  08/03/19 227 lb (103 kg)  06/28/19 227 lb (103 kg)  06/05/19 235 lb 0.2 oz (106.6 kg)    BP 124/84   Pulse 77   Temp (!) 95.6 F (35.3 C) (Temporal)   Ht 6' (1.829 m)   Wt 227 lb (103 kg)   SpO2 96%   BMI 30.79 kg/m   Assessment and Plan: 1. Type II diabetes mellitus with complication (HCC) Clinically stable by exam and report without s/s of hypoglycemia. DM complicated by htn. Tolerating medications Januvia well without side effects or other concerns. Eye exam upcoming - Hemoglobin A1c  2. Essential hypertension Clinically stable exam with well controlled BP on lisinopril. Tolerating medications without side effects at this time. Pt to continue current regimen and low sodium diet; benefits of regular exercise as able discussed.  3. Hyperlipidemia associated with type 2 diabetes mellitus ([place]) Tolerating statin medication without side effects at this time LDL is at goal of < 70 on current dose Continue same therapy without change at this time.  4. Psoriatic arthritis ([place]) Doing well on MTX - followed by Rheumatology   Partially dictated using Dragon software. Any errors are unintentional.  Halina Maidens, MD [facility]  08/03/2019

## 2019-08-03 NOTE — Patient Instructions (Signed)
Covid Vaccine locations:  [facility] [phone]  [place] [phone]  [email or web address]  

## 2019-08-29 DIAGNOSIS — M7652 Patellar tendinitis, left knee: Secondary | ICD-10-CM | POA: Diagnosis not present

## 2019-09-13 ENCOUNTER — Encounter: Payer: Self-pay | Admitting: Internal Medicine

## 2019-10-10 ENCOUNTER — Telehealth: Payer: Self-pay | Admitting: Internal Medicine

## 2019-10-10 NOTE — Telephone Encounter (Signed)
Copied from CRM [id number]. Topic: General - Other >> Oct 10, 2019  8:17 AM Tamela Oddi wrote: Reason for CRM: Patient requests that the nurse call him regarding medication.  Patient stated that he had gotten a prescription care previously for his sitaGLIPtin (JANUVIA) 100 MG tablet.  He stated that the card ran out and he would like another card to receive the discount on medication.  Please call to discuss at [phone]

## 2019-10-10 NOTE — Telephone Encounter (Signed)
Called and spoke with pt. Placed a new savings card up front for the patient to pick up. He verbalized understanding.  CM

## 2019-11-10 DIAGNOSIS — Z79899 Other long term (current) drug therapy: Secondary | ICD-10-CM | POA: Diagnosis not present

## 2019-11-24 ENCOUNTER — Other Ambulatory Visit: Payer: Self-pay

## 2019-11-24 ENCOUNTER — Encounter: Payer: Self-pay | Admitting: Internal Medicine

## 2019-11-24 ENCOUNTER — Ambulatory Visit: Payer: BC Managed Care – PPO | Admitting: Internal Medicine

## 2019-11-24 VITALS — BP 135/78 | HR 77 | Temp 98.0°F | Ht 72.0 in | Wt 231.0 lb

## 2019-11-24 DIAGNOSIS — I10 Essential (primary) hypertension: Secondary | ICD-10-CM | POA: Diagnosis not present

## 2019-11-24 DIAGNOSIS — E785 Hyperlipidemia, unspecified: Secondary | ICD-10-CM | POA: Diagnosis not present

## 2019-11-24 DIAGNOSIS — E1169 Type 2 diabetes mellitus with other specified complication: Secondary | ICD-10-CM | POA: Diagnosis not present

## 2019-11-24 DIAGNOSIS — E118 Type 2 diabetes mellitus with unspecified complications: Secondary | ICD-10-CM | POA: Diagnosis not present

## 2019-11-24 NOTE — Progress Notes (Signed)
Date:  11/24/2019   Name:  Russell Wright   DOB:  05-18-1961   MRN:  [id number]   Chief Complaint: Diabetes (follow up ) and Hypertension  Diabetes He presents for his follow-up diabetic visit. He has type 2 diabetes mellitus. His disease course has been stable. Pertinent negatives for hypoglycemia include no headaches or tremors. Pertinent negatives for diabetes include no chest pain, no fatigue, no polydipsia and no polyuria. Current diabetic treatment includes oral agent (monotherapy). He is compliant with treatment all of the time. An ACE inhibitor/angiotensin II receptor blocker is being taken. Eye exam is not current.  Hypertension The problem is controlled. Pertinent negatives include no chest pain, headaches, palpitations or shortness of breath. Risk factors for coronary artery disease include diabetes mellitus and dyslipidemia. Past treatments include ACE inhibitors. The current treatment provides significant improvement.  Hyperlipidemia This is a chronic problem. The problem is controlled. Pertinent negatives include no chest pain or shortness of breath. Current antihyperlipidemic treatment includes statins.    Lab Results  Component Value Date   CREATININE 0.87 06/28/2019   BUN 15 06/28/2019   NA 133 (L) 06/28/2019   K 4.0 06/28/2019   CL 103 06/28/2019   CO2 22 06/28/2019   Lab Results  Component Value Date   CHOL 174 03/28/2019   HDL 34 (L) 03/28/2019   LDLCALC 98 03/28/2019   TRIG 249 (H) 03/28/2019   CHOLHDL 5.1 (H) 03/28/2019   No results found for: TSH Lab Results  Component Value Date   HGBA1C 7.0 (H) 08/03/2019   Lab Results  Component Value Date   WBC 6.2 06/05/2019   HGB 15.8 06/05/2019   HCT 43.9 06/05/2019   MCV 83.3 06/05/2019   PLT 174 06/05/2019   Lab Results  Component Value Date   ALT 44 06/05/2019   AST 55 (H) 06/05/2019   ALKPHOS 67 06/05/2019   BILITOT 0.5 06/05/2019     Review of Systems  Constitutional: Negative for appetite  change, fatigue and unexpected weight change.  Eyes: Negative for visual disturbance.  Respiratory: Negative for cough, shortness of breath and wheezing.   Cardiovascular: Negative for chest pain, palpitations and leg swelling.  Gastrointestinal: Negative for abdominal pain and blood in stool.  Endocrine: Negative for polydipsia and polyuria.  Genitourinary: Negative for dysuria and hematuria.  Skin: Negative for color change, rash and wound.  Neurological: Negative for tremors, numbness and headaches.  Psychiatric/Behavioral: Negative for dysphoric mood and sleep disturbance.    Patient Active Problem List   Diagnosis Date Noted  . Sleep disturbance 09/15/2017  . Psoriatic arthritis ([place]) 06/17/2017  . Essential hypertension 06/17/2017  . Hyperlipidemia associated with type 2 diabetes mellitus ([place]) 06/17/2017  . Type II diabetes mellitus with complication ([place]) 16/96/7893  . Hair loss   . Psoriasis, unspecified 09/19/2016    Allergies  Allergen Reactions  . Metformin And Related Diarrhea    Past Surgical History:  Procedure Laterality Date  . COLONOSCOPY  05/09/2017   cleared for 10 years  . TYMPANOPLASTY     repair- 59 years old    Social History   Tobacco Use  . Smoking status: Never Smoker  . Smokeless tobacco: Never Used  Vaping Use  . Vaping Use: Never used  Substance Use Topics  . Alcohol use: Yes    Comment: rarely  . Drug use: No     Medication list has been reviewed and updated.  Current Meds  Medication Sig  . atorvastatin (LIPITOR) 20  MG tablet TAKE 1 TABLET BY MOUTH ONCE DAILY  . folic acid (FOLVITE) 1 MG tablet Take 2 mg by mouth daily.   Marland Kitchen lisinopril (ZESTRIL) 40 MG tablet TAKE 1 TABLET BY MOUTH ONCE DAILY  . methotrexate 2.5 MG tablet Take 15 mg by mouth once a week. Weekly on thursdays.   . sitaGLIPtin (JANUVIA) 100 MG tablet Take 1 tablet (100 mg total) by mouth daily.    PHQ 2/9 Scores 11/24/2019 08/03/2019 06/28/2019 03/28/2019  PHQ - 2  Score 0 0 0 0  PHQ- 9 Score 0 0 0 -  Exception Documentation - - - -    GAD 7 : Generalized Anxiety Score 11/24/2019  Nervous, Anxious, on Edge 0  Control/stop worrying 0  Worry too much - different things 0  Trouble relaxing 0  Restless 0  Easily annoyed or irritable 0  Afraid - awful might happen 0  Total GAD 7 Score 0  Anxiety Difficulty Not difficult at all    BP Readings from Last 3 Encounters:  11/24/19 (!) 122/96  08/03/19 124/84  06/28/19 138/86    Physical Exam Vitals and nursing note reviewed.  Constitutional:      General: He is not in acute distress.    Appearance: He is well-developed.  HENT:     Head: Normocephalic and atraumatic.  Cardiovascular:     Rate and Rhythm: Normal rate and regular rhythm.     Pulses: Normal pulses.     Heart sounds: No murmur heard.   Pulmonary:     Effort: Pulmonary effort is normal. No respiratory distress.     Breath sounds: No wheezing or rhonchi.  Musculoskeletal:     Cervical back: Normal range of motion.     Right lower leg: No edema.     Left lower leg: No edema.  Lymphadenopathy:     Cervical: No cervical adenopathy.  Skin:    General: Skin is warm and dry.     Findings: No rash.  Neurological:     General: No focal deficit present.     Mental Status: He is alert and oriented to person, place, and time.  Psychiatric:        Mood and Affect: Mood normal.     Wt Readings from Last 3 Encounters:  11/24/19 231 lb (104.8 kg)  08/03/19 227 lb (103 kg)  06/28/19 227 lb (103 kg)    BP (!) 122/96   Pulse 77   Temp 98 F (36.7 C) (Oral)   Ht 6' (1.829 m)   Wt 231 lb (104.8 kg)   SpO2 97%   BMI 31.33 kg/m   Assessment and Plan: 1. Type II diabetes mellitus with complication (HCC) Clinically stable by exam and report without s/s of hypoglycemia. DM complicated by htn. Tolerating medications -januvia -  well without side effects or other concerns. - Hemoglobin A1c  2. Essential  hypertension controlled  3. Hyperlipidemia associated with type 2 diabetes mellitus (HCC) On appropriate statin therapy   Partially dictated using Animal nutritionist. Any errors are unintentional.  Bari Edward, MD [facility]  11/24/2019

## 2019-11-25 LAB — HEMOGLOBIN A1C
Est. average glucose Bld gHb Est-mCnc: 151 mg/dL
Hgb A1c MFr Bld: 6.9 % — ABNORMAL HIGH (ref 4.8–5.6)

## 2019-11-30 ENCOUNTER — Other Ambulatory Visit: Payer: Self-pay | Admitting: Internal Medicine

## 2019-11-30 DIAGNOSIS — I10 Essential (primary) hypertension: Secondary | ICD-10-CM

## 2019-11-30 NOTE — Telephone Encounter (Signed)
Approved per protocol. Future appointment scheduled for October.  Requested Prescriptions  Pending Prescriptions Disp Refills   atorvastatin (LIPITOR) 20 MG tablet [Pharmacy Med Name: ATORVASTATIN CALCIUM 20 MG TAB] 90 tablet 1    Sig: TAKE 1 TABLET BY MOUTH ONCE DAILY     Cardiovascular:  Antilipid - Statins Failed - 11/30/2019 10:15 AM      Failed - LDL in normal range and within 360 days    LDL Chol Calc (NIH)  Date Value Ref Range Status  03/28/2019 98 0 - 99 mg/dL Final         Failed - HDL in normal range and within 360 days    HDL  Date Value Ref Range Status  03/28/2019 34 (L) >39 mg/dL Final         Failed - Triglycerides in normal range and within 360 days    Triglycerides  Date Value Ref Range Status  03/28/2019 249 (H) 0 - 149 mg/dL Final         Passed - Total Cholesterol in normal range and within 360 days    Cholesterol, Total  Date Value Ref Range Status  03/28/2019 174 100 - 199 mg/dL Final         Passed - Patient is not pregnant      Passed - Valid encounter within last 12 months    Recent Outpatient Visits          6 days ago Type II diabetes mellitus with complication [facility])   [facility] Reubin Milan, MD   3 months ago Type II diabetes mellitus with complication [facility])   [facility] Reubin Milan, MD   5 months ago Pneumonia due to COVID-19 virus   [facility] Reubin Milan, MD   8 months ago Annual physical exam   [facility] Reubin Milan, MD   1 year ago Essential hypertension   [facility] Reubin Milan, MD      Future Appointments            In 4 months Judithann Graves Nyoka Cowden, MD [facility], [facility]

## 2019-12-01 ENCOUNTER — Ambulatory Visit: Payer: BC Managed Care – PPO | Admitting: Internal Medicine

## 2020-01-09 DIAGNOSIS — L400 Psoriasis vulgaris: Secondary | ICD-10-CM | POA: Diagnosis not present

## 2020-01-09 DIAGNOSIS — L820 Inflamed seborrheic keratosis: Secondary | ICD-10-CM | POA: Diagnosis not present

## 2020-02-06 DIAGNOSIS — H903 Sensorineural hearing loss, bilateral: Secondary | ICD-10-CM | POA: Diagnosis not present

## 2020-02-06 DIAGNOSIS — H6123 Impacted cerumen, bilateral: Secondary | ICD-10-CM | POA: Diagnosis not present

## 2020-02-06 DIAGNOSIS — H905 Unspecified sensorineural hearing loss: Secondary | ICD-10-CM | POA: Diagnosis not present

## 2020-03-27 ENCOUNTER — Telehealth: Payer: Self-pay | Admitting: Internal Medicine

## 2020-03-27 NOTE — Telephone Encounter (Signed)
Please Advise.  KP

## 2020-03-27 NOTE — Telephone Encounter (Signed)
Pt is calling and his insurance will no longer pay for januvia. Pt insurance will like for him to try metformin. Pt said he has tried metformin and that med did not work for him. Pt will need PA for januvia. [facility]. Pt would like a nurse to return his call

## 2020-03-27 NOTE — Telephone Encounter (Signed)
Called pt left VM that pt would need to call insurance to see which medication is covered and give [place] a call back to let us know. Also that metformin is not in the same class. Pts name was stated on VM.  KP

## 2020-03-27 NOTE — Telephone Encounter (Signed)
He will need to call his insurance and see what alternative they have among:   Tradjenta, Onglyza or Nesina.  Metformin is not even in the same class of medication as Januvia.

## 2020-03-29 ENCOUNTER — Encounter: Payer: BC Managed Care – PPO | Admitting: Internal Medicine

## 2020-04-05 ENCOUNTER — Ambulatory Visit (INDEPENDENT_AMBULATORY_CARE_PROVIDER_SITE_OTHER): Payer: Managed Care, Other (non HMO)

## 2020-04-05 ENCOUNTER — Other Ambulatory Visit: Payer: Self-pay

## 2020-04-05 DIAGNOSIS — Z23 Encounter for immunization: Secondary | ICD-10-CM | POA: Diagnosis not present

## 2020-04-06 ENCOUNTER — Telehealth: Payer: Self-pay

## 2020-04-06 NOTE — Telephone Encounter (Signed)
Completed paper PA on Januvia 100mg  tablets. Faxed to patients .  Awaiting results via FAX.

## 2020-04-09 NOTE — Telephone Encounter (Signed)
Patients PA has been approved.

## 2020-04-20 ENCOUNTER — Other Ambulatory Visit: Payer: Self-pay | Admitting: Internal Medicine

## 2020-04-20 DIAGNOSIS — E118 Type 2 diabetes mellitus with unspecified complications: Secondary | ICD-10-CM

## 2020-04-20 MED ORDER — SITAGLIPTIN PHOSPHATE 100 MG PO TABS: 100.0000 mg | ORAL_TABLET | Freq: Every day | ORAL | 0 refills | Status: DC

## 2020-04-20 NOTE — Telephone Encounter (Signed)
Medication: sitaGLIPtin (JANUVIA) 100 MG tablet [[id number]] - Pharmacy is calling stating last refill  10/19/20920  Has the patient contacted their pharmacy? YES  (Agent: If no, request that the patient contact the pharmacy for the refill.) (Agent: If yes, when and what did the pharmacy advise?)  Preferred Pharmacy (with phone number or street name): [facility] [place] [address] [place] [address] Phone: [phone] Fax: [phone] Hours: Not open 24 hours    Agent: Please be advised that RX refills may take up to 3 business days. We ask that you follow-up with your pharmacy.

## 2020-04-20 NOTE — Telephone Encounter (Signed)
Requested Prescriptions  Pending Prescriptions Disp Refills   sitaGLIPtin (JANUVIA) 100 MG tablet 90 tablet 0    Sig: Take 1 tablet (100 mg total) by mouth daily.     Endocrinology:  Diabetes - DPP-4 Inhibitors Passed - 04/20/2020  9:33 AM      Passed - HBA1C is between 0 and 7.9 and within 180 days    Hemoglobin A1C  Date Value Ref Range Status  04/22/2017 6.3  Final   Hgb A1c MFr Bld  Date Value Ref Range Status  11/24/2019 6.9 (H) 4.8 - 5.6 % Final    Comment:             Prediabetes: 5.7 - 6.4          Diabetes: >6.4          Glycemic control for adults with diabetes: <7.0          Passed - Cr in normal range and within 360 days    Creatinine, Ser  Date Value Ref Range Status  06/28/2019 0.87 0.61 - 1.24 mg/dL Final         Passed - Valid encounter within last 6 months    Recent Outpatient Visits          4 months ago Type II diabetes mellitus with complication [facility])   [facility] Reubin Milan, MD   8 months ago Type II diabetes mellitus with complication [facility])   [facility] Reubin Milan, MD   9 months ago Pneumonia due to COVID-19 virus   [facility] Reubin Milan, MD   1 year ago Annual physical exam   [facility] Reubin Milan, MD   1 year ago Essential hypertension   [facility] Reubin Milan, MD      Future Appointments            In 1 month Judithann Graves, Nyoka Cowden, MD [facility], [facility]

## 2020-06-07 ENCOUNTER — Other Ambulatory Visit: Payer: Self-pay | Admitting: Internal Medicine

## 2020-06-07 DIAGNOSIS — I10 Essential (primary) hypertension: Secondary | ICD-10-CM

## 2020-06-12 ENCOUNTER — Ambulatory Visit (INDEPENDENT_AMBULATORY_CARE_PROVIDER_SITE_OTHER): Payer: Managed Care, Other (non HMO) | Admitting: Internal Medicine

## 2020-06-12 ENCOUNTER — Other Ambulatory Visit: Payer: Self-pay

## 2020-06-12 ENCOUNTER — Encounter: Payer: Self-pay | Admitting: Internal Medicine

## 2020-06-12 VITALS — BP 136/88 | HR 85 | Temp 98.1°F | Ht 72.0 in | Wt 239.0 lb

## 2020-06-12 DIAGNOSIS — L4050 Arthropathic psoriasis, unspecified: Secondary | ICD-10-CM

## 2020-06-12 DIAGNOSIS — E785 Hyperlipidemia, unspecified: Secondary | ICD-10-CM | POA: Diagnosis not present

## 2020-06-12 DIAGNOSIS — Z0000 Encounter for general adult medical examination without abnormal findings: Secondary | ICD-10-CM

## 2020-06-12 DIAGNOSIS — I10 Essential (primary) hypertension: Secondary | ICD-10-CM

## 2020-06-12 DIAGNOSIS — Z125 Encounter for screening for malignant neoplasm of prostate: Secondary | ICD-10-CM

## 2020-06-12 DIAGNOSIS — E1169 Type 2 diabetes mellitus with other specified complication: Secondary | ICD-10-CM | POA: Diagnosis not present

## 2020-06-12 DIAGNOSIS — E118 Type 2 diabetes mellitus with unspecified complications: Secondary | ICD-10-CM | POA: Diagnosis not present

## 2020-06-12 MED ORDER — SITAGLIPTIN PHOSPHATE 100 MG PO TABS: 100.0000 mg | ORAL_TABLET | Freq: Every day | ORAL | 3 refills | Status: DC

## 2020-06-12 NOTE — Progress Notes (Signed)
Date:  06/12/2020   Name:  Russell Wright   DOB:  [birth year]   MRN:  [id number]   Chief Complaint: Annual Exam (Foot exam )  Russell Wright is a 60 y.o. male who presents today for his Complete Annual Exam. He feels well. He reports exercising none. He reports he is sleeping poorly. His mother was recently diagnoses with breast cancer but is holding up well.  Colonoscopy: 05/2016  Immunization History  Administered Date(s) Administered  . Influenza,inj,Quad PF,6+ Mos 06/22/2018, 03/28/2019, 04/05/2020  . Influenza,inj,quad, With Preservative 06/10/2015  . Influenza-Unspecified 04/09/2017  . Moderna Sars-Covid-2 Vaccination 09/01/2019, 10/01/2019  . Pneumococcal Polysaccharide-23 03/28/2019  . Tdap 04/16/2015    Diabetes He presents for his follow-up diabetic visit. He has type 2 diabetes mellitus. His disease course has been stable. Pertinent negatives for hypoglycemia include no dizziness, headaches or nervousness/anxiousness. Pertinent negatives for diabetes include no chest pain and no fatigue. There are no hypoglycemic complications. Current diabetic treatment includes oral agent (monotherapy). He is compliant with treatment all of the time. There is no compliance with monitoring of blood glucose. An ACE inhibitor/angiotensin II receptor blocker is being taken.  Hypertension This is a chronic problem. The problem is controlled. Pertinent negatives include no chest pain, headaches, palpitations or shortness of breath. Past treatments include ACE inhibitors.  Hyperlipidemia This is a chronic problem. The problem is controlled. Pertinent negatives include no chest pain, focal weakness, myalgias or shortness of breath. Current antihyperlipidemic treatment includes statins. The current treatment provides significant improvement of lipids.    Lab Results  Component Value Date   CREATININE 0.87 06/28/2019   BUN 15 06/28/2019   NA 133 (L) 06/28/2019   K 4.0 06/28/2019   CL 103  06/28/2019   CO2 22 06/28/2019   Lab Results  Component Value Date   CHOL 174 03/28/2019   HDL 34 (L) 03/28/2019   LDLCALC 98 03/28/2019   TRIG 249 (H) 03/28/2019   CHOLHDL 5.1 (H) 03/28/2019   No results found for: TSH Lab Results  Component Value Date   HGBA1C 6.9 (H) 11/24/2019   Lab Results  Component Value Date   WBC 6.2 06/05/2019   HGB 15.8 06/05/2019   HCT 43.9 06/05/2019   MCV 83.3 06/05/2019   PLT 174 06/05/2019   Lab Results  Component Value Date   ALT 44 06/05/2019   AST 55 (H) 06/05/2019   ALKPHOS 67 06/05/2019   BILITOT 0.5 06/05/2019     Review of Systems  Constitutional: Negative for appetite change, chills, diaphoresis, fatigue and unexpected weight change.  HENT: Negative for hearing loss, tinnitus, trouble swallowing and voice change.   Eyes: Negative for visual disturbance.  Respiratory: Negative for choking, shortness of breath and wheezing.   Cardiovascular: Negative for chest pain, palpitations and leg swelling.  Gastrointestinal: Negative for abdominal pain, blood in stool, constipation and diarrhea.  Genitourinary: Negative for difficulty urinating, dysuria and frequency.  Musculoskeletal: Positive for arthralgias. Negative for back pain and myalgias.  Skin: Negative for color change and rash.  Neurological: Negative for dizziness, focal weakness, syncope and headaches.  Hematological: Negative for adenopathy.  Psychiatric/Behavioral: Negative for dysphoric mood and sleep disturbance (intermittently poor sleep). The patient is not nervous/anxious.     Patient Active Problem List   Diagnosis Date Noted  . Sleep disturbance 09/15/2017  . Psoriatic arthritis ([place]) 06/17/2017  . Essential hypertension 06/17/2017  . Hyperlipidemia associated with type 2 diabetes mellitus ([place]) 06/17/2017  . Type II diabetes mellitus  with complication (HCC) 06/17/2017  . Hair loss   . Psoriasis, unspecified 09/19/2016    Allergies  Allergen Reactions  .  Metformin And Related Diarrhea and Other (See Comments)    Diarrhea causing dehydration    Past Surgical History:  Procedure Laterality Date  . COLONOSCOPY  05/09/2017   cleared for 10 years  . TYMPANOPLASTY     repair- 60 years old    Social History   Tobacco Use  . Smoking status: Never Smoker  . Smokeless tobacco: Never Used  Vaping Use  . Vaping Use: Never used  Substance Use Topics  . Alcohol use: Yes    Comment: rarely  . Drug use: No     Medication list has been reviewed and updated.  Current Meds  Medication Sig  . atorvastatin (LIPITOR) 20 MG tablet TAKE 1 TABLET BY MOUTH ONCE DAILY  . clobetasol cream (TEMOVATE) 0.05 % SMARTSIG:Sparingly Topical Twice Daily  . folic acid (FOLVITE) 1 MG tablet Take 2 mg by mouth daily.   Marland Kitchen lisinopril (ZESTRIL) 40 MG tablet TAKE 1 TABLET BY MOUTH ONCE DAILY  . methotrexate 2.5 MG tablet Take 8 mg by mouth once a week. Weekly on thursdays.  . sitaGLIPtin (JANUVIA) 100 MG tablet Take 1 tablet (100 mg total) by mouth daily.    PHQ 2/9 Scores 06/12/2020 11/24/2019 08/03/2019 06/28/2019  PHQ - 2 Score 0 0 0 0  PHQ- 9 Score 0 0 0 0  Exception Documentation - - - -    GAD 7 : Generalized Anxiety Score 06/12/2020 11/24/2019  Nervous, Anxious, on Edge 0 0  Control/stop worrying 0 0  Worry too much - different things 0 0  Trouble relaxing 0 0  Restless 0 0  Easily annoyed or irritable 0 0  Afraid - awful might happen 0 0  Total GAD 7 Score 0 0  Anxiety Difficulty - Not difficult at all    BP Readings from Last 3 Encounters:  06/12/20 136/88  11/24/19 135/78  08/03/19 124/84    Physical Exam Vitals and nursing note reviewed.  Constitutional:      Appearance: Normal appearance. He is well-developed.  HENT:     Head: Normocephalic.     Right Ear: Tympanic membrane, ear canal and external ear normal.     Left Ear: Tympanic membrane, ear canal and external ear normal.     Nose: Nose normal.  Eyes:     Conjunctiva/sclera:  Conjunctivae normal.     Pupils: Pupils are equal, round, and reactive to light.  Neck:     Thyroid: No thyromegaly.     Vascular: No carotid bruit.  Cardiovascular:     Rate and Rhythm: Normal rate and regular rhythm.     Heart sounds: Normal heart sounds.  Pulmonary:     Effort: Pulmonary effort is normal.     Breath sounds: Normal breath sounds. No wheezing.  Chest:  Breasts:     Right: No mass.     Left: No mass.    Abdominal:     General: Bowel sounds are normal.     Palpations: Abdomen is soft.     Tenderness: There is no abdominal tenderness.  Musculoskeletal:        General: Normal range of motion.     Cervical back: Normal range of motion and neck supple.  Lymphadenopathy:     Cervical: No cervical adenopathy.  Skin:    General: Skin is warm and dry.  Neurological:  Mental Status: He is alert and oriented to person, place, and time.     Deep Tendon Reflexes: Reflexes are normal and symmetric.  Psychiatric:        Attention and Perception: Attention normal.        Mood and Affect: Mood normal.        Thought Content: Thought content normal.     Wt Readings from Last 3 Encounters:  06/12/20 239 lb (108.4 kg)  11/24/19 231 lb (104.8 kg)  08/03/19 227 lb (103 kg)    BP 136/88   Pulse 85   Temp 98.1 F (36.7 C) (Oral)   Ht 6' (1.829 m)   Wt 239 lb (108.4 kg)   SpO2 95%   BMI 32.41 kg/m   Assessment and Plan: 1. Annual physical exam Exam is normal except for weight. Encourage regular exercise and appropriate dietary changes to avoid worsening of DM  2. Prostate cancer screening DRE deferred - PSA  3. Essential hypertension Clinically stable exam with well controlled BP. Tolerating medications without side effects at this time. Pt to continue current regimen and low sodium diet; benefits of regular exercise as able discussed. - CBC with Differential/Platelet - Comprehensive metabolic panel  4. Hyperlipidemia associated with type 2 diabetes  mellitus (HCC) Tolerating statin medication without side effects at this time LDL is almost at goal of < 70 on current dose Continue same therapy without change at this time. - Lipid panel  5. Type II diabetes mellitus with complication (HCC) BS appears controlled without s/s of hypoglycemia - Hemoglobin A1c - sitaGLIPtin (JANUVIA) 100 MG tablet; Take 1 tablet (100 mg total) by mouth daily.  Dispense: 90 tablet; Refill: 3  6. Psoriatic arthritis (HCC) On MTX Follow up with Rheumatology as scheduled.   Partially dictated using Animal nutritionist. Any errors are unintentional.  Bari Edward, MD [facility]  06/12/2020

## 2020-06-13 LAB — CBC WITH DIFFERENTIAL/PLATELET
Basophils Absolute: 0.1 10*3/uL (ref 0.0–0.2)
Basos: 1 %
EOS (ABSOLUTE): 0.1 10*3/uL (ref 0.0–0.4)
Eos: 1 %
Hematocrit: 49.1 % (ref 37.5–51.0)
Hemoglobin: 16.3 g/dL (ref 13.0–17.7)
Immature Grans (Abs): 0.1 10*3/uL (ref 0.0–0.1)
Immature Granulocytes: 1 %
Lymphocytes Absolute: 2.3 10*3/uL (ref 0.7–3.1)
Lymphs: 20 %
MCH: 29.6 pg (ref 26.6–33.0)
MCHC: 33.2 g/dL (ref 31.5–35.7)
MCV: 89 fL (ref 79–97)
Monocytes Absolute: 0.7 10*3/uL (ref 0.1–0.9)
Monocytes: 6 %
Neutrophils Absolute: 7.9 10*3/uL — ABNORMAL HIGH (ref 1.4–7.0)
Neutrophils: 71 %
Platelets: 313 10*3/uL (ref 150–450)
RBC: 5.5 x10E6/uL (ref 4.14–5.80)
RDW: 13.4 % (ref 11.6–15.4)
WBC: 11.2 10*3/uL — ABNORMAL HIGH (ref 3.4–10.8)

## 2020-06-13 LAB — LIPID PANEL
Chol/HDL Ratio: 4.6 ratio (ref 0.0–5.0)
Cholesterol, Total: 171 mg/dL (ref 100–199)
HDL: 37 mg/dL — ABNORMAL LOW (ref >39)
LDL Chol Calc (NIH): 106 mg/dL — ABNORMAL HIGH (ref 0–99)
Triglycerides: 161 mg/dL — ABNORMAL HIGH (ref 0–149)
VLDL Cholesterol Cal: 28 mg/dL (ref 5–40)

## 2020-06-13 LAB — HEMOGLOBIN A1C
Est. average glucose Bld gHb Est-mCnc: 177 mg/dL
Hgb A1c MFr Bld: 7.8 % — ABNORMAL HIGH (ref 4.8–5.6)

## 2020-06-13 LAB — COMPREHENSIVE METABOLIC PANEL
ALT: 32 IU/L (ref 0–44)
AST: 18 IU/L (ref 0–40)
Albumin/Globulin Ratio: 1.7 (ref 1.2–2.2)
Albumin: 4.7 g/dL (ref 3.8–4.9)
Alkaline Phosphatase: 86 IU/L (ref 44–121)
BUN/Creatinine Ratio: 14 (ref 9–20)
BUN: 13 mg/dL (ref 6–24)
Bilirubin Total: 0.3 mg/dL (ref 0.0–1.2)
CO2: 23 mmol/L (ref 20–29)
Calcium: 10.2 mg/dL (ref 8.7–10.2)
Chloride: 100 mmol/L (ref 96–106)
Creatinine, Ser: 0.94 mg/dL (ref 0.76–1.27)
GFR calc Af Amer: 104 mL/min/{1.73_m2} (ref >59)
GFR calc non Af Amer: 90 mL/min/{1.73_m2} (ref >59)
Globulin, Total: 2.7 g/dL (ref 1.5–4.5)
Glucose: 149 mg/dL — ABNORMAL HIGH (ref 65–99)
Potassium: 4.5 mmol/L (ref 3.5–5.2)
Sodium: 138 mmol/L (ref 134–144)
Total Protein: 7.4 g/dL (ref 6.0–8.5)

## 2020-06-13 LAB — PSA: Prostate Specific Ag, Serum: 0.8 ng/mL (ref 0.0–4.0)

## 2020-08-22 IMAGING — DX DG CHEST 1V PORT
1 series · 1 of 1 positions shown · non-contrast
Comparison: None.

CLINICAL DATA: Cough with nausea and diarrhea.

EXAM:
PORTABLE CHEST 1 VIEW

[chest ap]
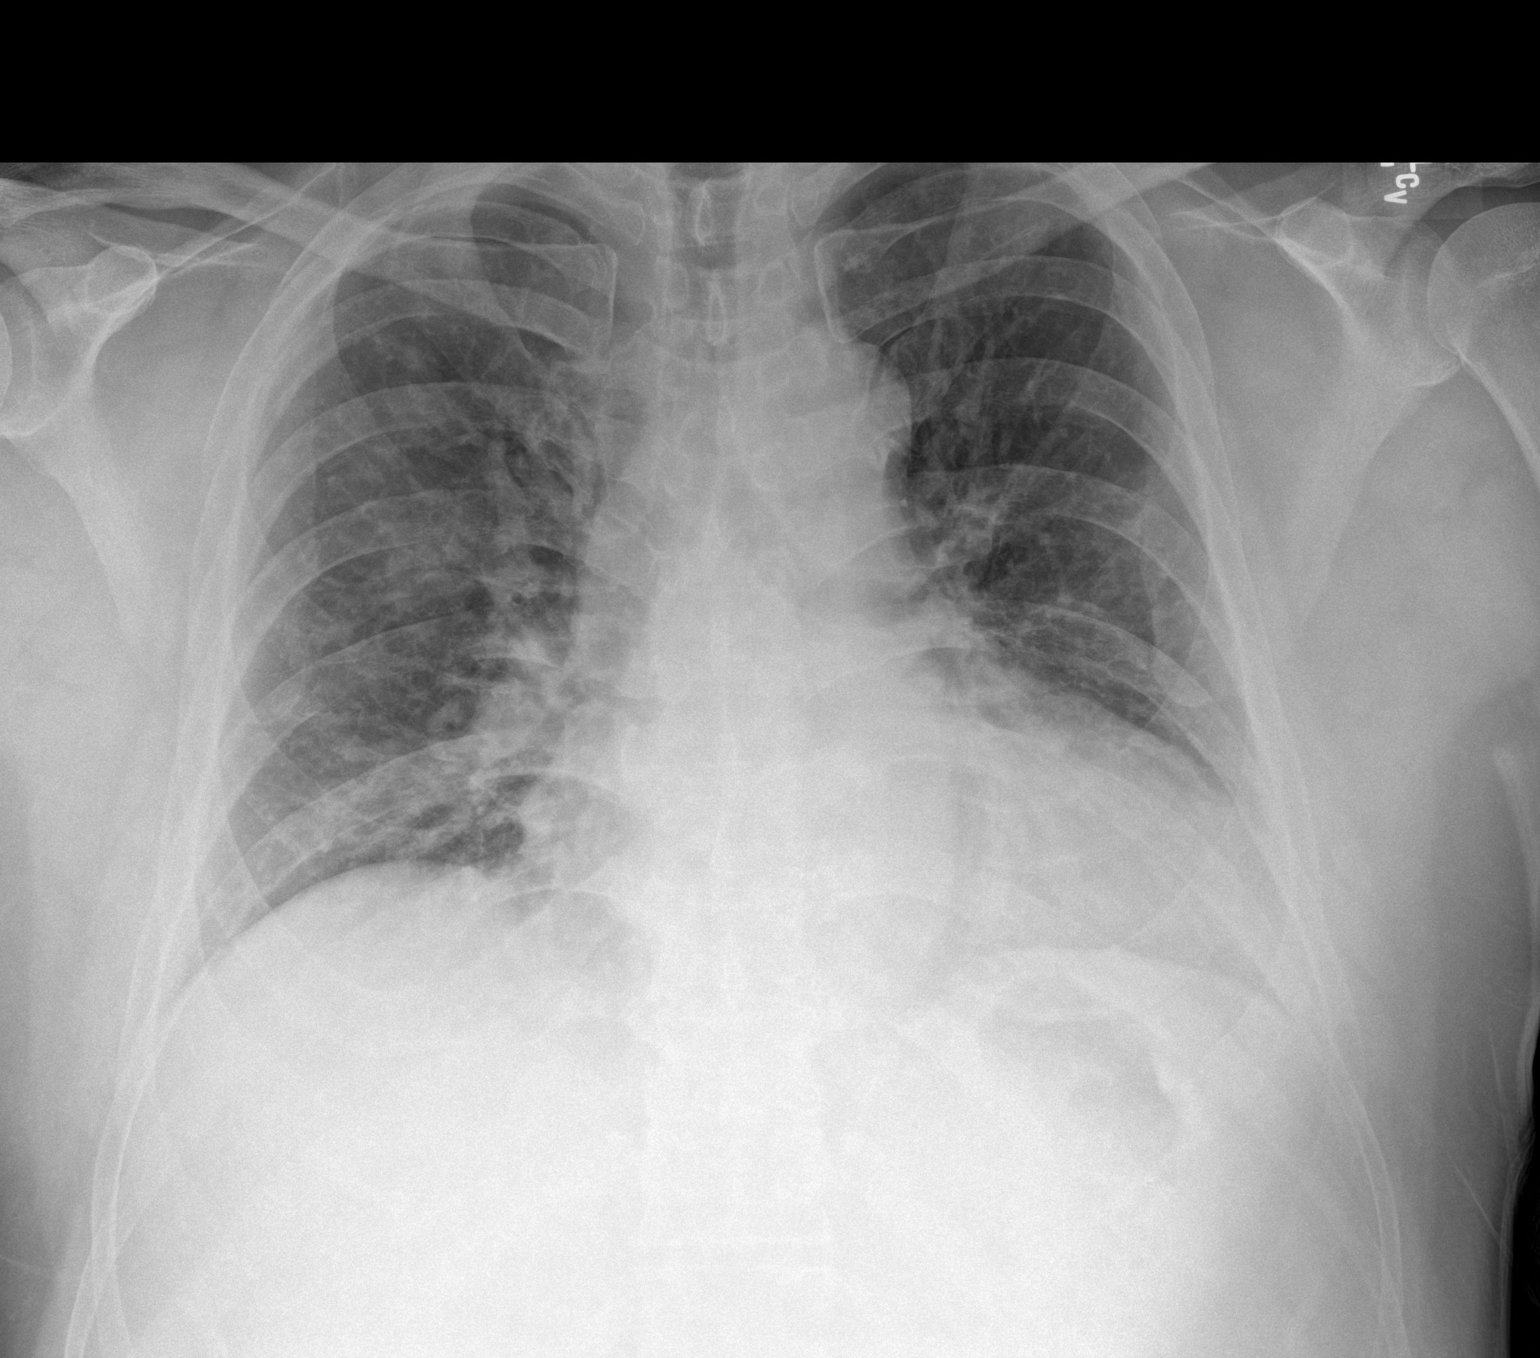

[1 of 1 positions shown; findings below may reference images not displayed]

FINDINGS: 4756 hours. The cardio pericardial silhouette is enlarged. There is
pulmonary vascular congestion without overt pulmonary edema. Patchy
bilateral airspace disease, right greater than left suggest
multifocal pneumonia. No evidence for pleural effusion. The
visualized bony structures of the thorax are intact.
IMPRESSION: Cardiomegaly with right greater than left patchy ill-defined
airspace opacities suggesting multifocal pneumonia. Atypical and
viral etiology should be considered.

## 2020-10-03 ENCOUNTER — Telehealth: Payer: Self-pay

## 2020-10-03 NOTE — Telephone Encounter (Signed)
Initiated Januvia copay card on behalf of patient.  Sent coupon to patient's cell phone and e-mail.  Patient's wife notified and verbalized understanding.

## 2020-10-03 NOTE — Telephone Encounter (Signed)
Called and left a message notifying to go to [email or web address] and click on the 'Coupon' hyperlink at the top of the page.  It will then prompt for patient information and generate a copay card based on commercial insurance as low as $5 a month.  Patient has [employer] so he should qualify.

## 2020-10-03 NOTE — Telephone Encounter (Signed)
Only copay cards available are for Jardiance/ Synjardy.  There are samples of Januvia, but no copay cards with them.  Please advise.

## 2020-10-03 NOTE — Telephone Encounter (Signed)
They can try to go online to the company website and print off a copay card if available.

## 2020-10-03 NOTE — Telephone Encounter (Unsigned)
Copied from CRM [id number]. Topic: General - Other >> Oct 03, 2020  8:01 AM Jaquita Rector A wrote: Reason for CRM: Patient wife Lupita Leash called in asking if nurse can please get her an Rx discount card for sitaGLIPtin (JANUVIA) 100 MG tablet so that this can be more cost effective. Please call her at Ph# [phone] ok to lm if no answer

## 2020-10-18 ENCOUNTER — Ambulatory Visit: Payer: Managed Care, Other (non HMO) | Admitting: Internal Medicine

## 2020-10-18 ENCOUNTER — Other Ambulatory Visit: Payer: Self-pay

## 2020-10-18 ENCOUNTER — Encounter: Payer: Self-pay | Admitting: Internal Medicine

## 2020-10-18 VITALS — BP 136/84 | HR 72 | Temp 97.9°F | Ht 72.0 in | Wt 226.0 lb

## 2020-10-18 DIAGNOSIS — I10 Essential (primary) hypertension: Secondary | ICD-10-CM | POA: Diagnosis not present

## 2020-10-18 DIAGNOSIS — E118 Type 2 diabetes mellitus with unspecified complications: Secondary | ICD-10-CM | POA: Diagnosis not present

## 2020-10-18 LAB — POCT GLYCOSYLATED HEMOGLOBIN (HGB A1C): Hemoglobin A1C: 6.5 % — AB (ref 4.0–5.6)

## 2020-10-18 NOTE — Progress Notes (Signed)
Date:  10/18/2020   Name:  Russell Wright   DOB:  10-10-1960   MRN:  [id number]   Chief Complaint: Diabetes (POCT A1C )  Diabetes He presents for his follow-up diabetic visit. He has type 2 diabetes mellitus. His disease course has been stable. Pertinent negatives for hypoglycemia include no headaches or tremors. Pertinent negatives for diabetes include no chest pain, no fatigue, no polydipsia and no polyuria. There are no hypoglycemic complications. Symptoms are improving. Current diabetic treatment includes oral agent (monotherapy) (Januvia). He is compliant with treatment all of the time. His weight is stable. He is following a generally healthy diet. He participates in exercise daily. An ACE inhibitor/angiotensin II receptor blocker is being taken. Eye exam is not current.  He has been walking every day since January and eating very little sweets.  He feels well and has lost about 15 lbs.  Lab Results  Component Value Date   CREATININE 0.94 06/12/2020   BUN 13 06/12/2020   NA 138 06/12/2020   K 4.5 06/12/2020   CL 100 06/12/2020   CO2 23 06/12/2020   Lab Results  Component Value Date   CHOL 171 06/12/2020   HDL 37 (L) 06/12/2020   LDLCALC 106 (H) 06/12/2020   TRIG 161 (H) 06/12/2020   CHOLHDL 4.6 06/12/2020   No results found for: TSH Lab Results  Component Value Date   HGBA1C 7.8 (H) 06/12/2020   Lab Results  Component Value Date   WBC 11.2 (H) 06/12/2020   HGB 16.3 06/12/2020   HCT 49.1 06/12/2020   MCV 89 06/12/2020   PLT 313 06/12/2020   Lab Results  Component Value Date   ALT 32 06/12/2020   AST 18 06/12/2020   ALKPHOS 86 06/12/2020   BILITOT 0.3 06/12/2020     Review of Systems  Constitutional: Negative for appetite change, fatigue and unexpected weight change.  Eyes: Negative for visual disturbance.  Respiratory: Negative for cough, shortness of breath and wheezing.   Cardiovascular: Negative for chest pain, palpitations and leg swelling.   Gastrointestinal: Negative for abdominal pain and blood in stool.  Endocrine: Negative for polydipsia and polyuria.  Genitourinary: Negative for dysuria and hematuria.  Skin: Negative for color change and rash.  Neurological: Negative for tremors, numbness and headaches.  Psychiatric/Behavioral: Negative for dysphoric mood.    Patient Active Problem List   Diagnosis Date Noted  . Sleep disturbance 09/15/2017  . Psoriatic arthritis (HCC) 06/17/2017  . Essential hypertension 06/17/2017  . Hyperlipidemia associated with type 2 diabetes mellitus (HCC) 06/17/2017  . Type II diabetes mellitus with complication (HCC) 06/17/2017  . Hair loss   . Psoriasis, unspecified 09/19/2016    Allergies  Allergen Reactions  . Metformin And Related Diarrhea and Other (See Comments)    Diarrhea causing dehydration    Past Surgical History:  Procedure Laterality Date  . COLONOSCOPY  05/09/2017   cleared for 10 years  . TYMPANOPLASTY     repair- 60 years old    Social History   Tobacco Use  . Smoking status: Never Smoker  . Smokeless tobacco: Never Used  Vaping Use  . Vaping Use: Never used  Substance Use Topics  . Alcohol use: Yes    Comment: rarely  . Drug use: No     Medication list has been reviewed and updated.  Current Meds  Medication Sig  . atorvastatin (LIPITOR) 20 MG tablet TAKE 1 TABLET BY MOUTH ONCE DAILY  . clobetasol cream (TEMOVATE) 0.05 %  SMARTSIG:Sparingly Topical Twice Daily  . folic acid (FOLVITE) 1 MG tablet Take 2 mg by mouth daily.   Marland Kitchen lisinopril (ZESTRIL) 40 MG tablet TAKE 1 TABLET BY MOUTH ONCE DAILY  . methotrexate 2.5 MG tablet Take 8 mg by mouth once a week. Weekly on thursdays.  . sitaGLIPtin (JANUVIA) 100 MG tablet Take 1 tablet (100 mg total) by mouth daily.    PHQ 2/9 Scores 10/18/2020 06/12/2020 11/24/2019 08/03/2019  PHQ - 2 Score 0 0 0 0  PHQ- 9 Score 2 0 0 0  Exception Documentation - - - -    GAD 7 : Generalized Anxiety Score 10/18/2020  06/12/2020 11/24/2019  Nervous, Anxious, on Edge 0 0 0  Control/stop worrying 1 0 0  Worry too much - different things 1 0 0  Trouble relaxing 0 0 0  Restless 0 0 0  Easily annoyed or irritable 0 0 0  Afraid - awful might happen 0 0 0  Total GAD 7 Score 2 0 0  Anxiety Difficulty Not difficult at all - Not difficult at all    BP Readings from Last 3 Encounters:  10/18/20 136/84  06/12/20 136/88  11/24/19 135/78    Physical Exam Vitals and nursing note reviewed.  Constitutional:      General: He is not in acute distress.    Appearance: Normal appearance. He is well-developed.  HENT:     Head: Normocephalic and atraumatic.  Neck:     Vascular: No carotid bruit.  Cardiovascular:     Rate and Rhythm: Normal rate and regular rhythm.  Pulmonary:     Effort: Pulmonary effort is normal. No respiratory distress.     Breath sounds: No wheezing or rhonchi.  Musculoskeletal:     Cervical back: Normal range of motion.     Right lower leg: No edema.     Left lower leg: No edema.  Lymphadenopathy:     Cervical: No cervical adenopathy.  Skin:    General: Skin is warm and dry.     Findings: No rash.  Neurological:     General: No focal deficit present.     Mental Status: He is alert and oriented to person, place, and time.  Psychiatric:        Mood and Affect: Mood normal.        Behavior: Behavior normal.     Wt Readings from Last 3 Encounters:  10/18/20 226 lb (102.5 kg)  06/12/20 239 lb (108.4 kg)  11/24/19 231 lb (104.8 kg)    BP 136/84   Pulse 72   Temp 97.9 F (36.6 C) (Oral)   Ht 6' (1.829 m)   Wt 226 lb (102.5 kg)   SpO2 97%   BMI 30.65 kg/m   Assessment and Plan: 1. Type II diabetes mellitus with complication (HCC) Blood sugar is much improved with diet and exercise and weight loss Continue current regimen and medications. Reminded to schedule DM eye exam - POCT HgB A1C = 6.5 down from 7.8  2. Essential hypertension Clinically stable exam with well  controlled BP. Tolerating medications without side effects at this time. Pt to continue current regimen and low sodium diet; benefits of regular exercise as able discussed.   Partially dictated using Animal nutritionist. Any errors are unintentional.  Bari Edward, MD [facility]  10/18/2020

## 2020-12-03 ENCOUNTER — Other Ambulatory Visit: Payer: Self-pay | Admitting: Internal Medicine

## 2020-12-03 DIAGNOSIS — I10 Essential (primary) hypertension: Secondary | ICD-10-CM

## 2021-02-08 ENCOUNTER — Ambulatory Visit: Payer: Managed Care, Other (non HMO) | Admitting: Internal Medicine

## 2021-02-08 ENCOUNTER — Other Ambulatory Visit: Payer: Self-pay

## 2021-02-08 ENCOUNTER — Encounter: Payer: Self-pay | Admitting: Internal Medicine

## 2021-02-08 VITALS — BP 138/88 | HR 72 | Temp 98.2°F | Ht 72.0 in | Wt 224.0 lb

## 2021-02-08 DIAGNOSIS — I10 Essential (primary) hypertension: Secondary | ICD-10-CM | POA: Diagnosis not present

## 2021-02-08 DIAGNOSIS — E118 Type 2 diabetes mellitus with unspecified complications: Secondary | ICD-10-CM | POA: Diagnosis not present

## 2021-02-08 DIAGNOSIS — Z23 Encounter for immunization: Secondary | ICD-10-CM | POA: Diagnosis not present

## 2021-02-08 LAB — POCT GLYCOSYLATED HEMOGLOBIN (HGB A1C): Hemoglobin A1C: 6.5 % — AB (ref 4.0–5.6)

## 2021-02-08 NOTE — Progress Notes (Signed)
Date:  02/08/2021   Name:  Russell Wright   DOB:  1960-06-25   MRN:  [id number]   Chief Complaint: Diabetes and Flu Vaccine  Diabetes He presents for his follow-up diabetic visit. He has type 2 diabetes mellitus. His disease course has been stable. Pertinent negatives for hypoglycemia include no headaches or tremors. Pertinent negatives for diabetes include no chest pain, no fatigue, no polydipsia and no polyuria. Current diabetic treatment includes oral agent (monotherapy) Alma Friendly). He is compliant with treatment all of the time. An ACE inhibitor/angiotensin II receptor blocker is being taken. Eye exam is not current.  Hypertension This is a chronic problem. The problem is controlled. Pertinent negatives include no chest pain, headaches, palpitations or shortness of breath. Past treatments include ACE inhibitors. The current treatment provides significant improvement. There are no compliance problems.  There is no history of kidney disease or CAD/MI.   Lab Results  Component Value Date   CREATININE 0.94 06/12/2020   BUN 13 06/12/2020   NA 138 06/12/2020   K 4.5 06/12/2020   CL 100 06/12/2020   CO2 23 06/12/2020   Lab Results  Component Value Date   CHOL 171 06/12/2020   HDL 37 (L) 06/12/2020   LDLCALC 106 (H) 06/12/2020   TRIG 161 (H) 06/12/2020   CHOLHDL 4.6 06/12/2020   No results found for: TSH Lab Results  Component Value Date   HGBA1C 6.5 (A) 02/08/2021   Lab Results  Component Value Date   WBC 11.2 (H) 06/12/2020   HGB 16.3 06/12/2020   HCT 49.1 06/12/2020   MCV 89 06/12/2020   PLT 313 06/12/2020   Lab Results  Component Value Date   ALT 32 06/12/2020   AST 18 06/12/2020   ALKPHOS 86 06/12/2020   BILITOT 0.3 06/12/2020     Review of Systems  Constitutional:  Negative for appetite change, fatigue and unexpected weight change.  Eyes:  Negative for visual disturbance.  Respiratory:  Negative for cough, shortness of breath and wheezing.   Cardiovascular:   Negative for chest pain, palpitations and leg swelling.  Gastrointestinal:  Negative for abdominal pain and blood in stool.  Endocrine: Negative for polydipsia and polyuria.  Genitourinary:  Negative for dysuria and hematuria.  Skin:  Negative for color change and rash.  Neurological:  Negative for tremors, numbness and headaches.  Psychiatric/Behavioral:  Positive for sleep disturbance (occasional insomnia). Negative for dysphoric mood.    Patient Active Problem List   Diagnosis Date Noted   Sleep disturbance 09/15/2017   Psoriatic arthritis (HCC) 06/17/2017   Essential hypertension 06/17/2017   Hyperlipidemia associated with type 2 diabetes mellitus (HCC) 06/17/2017   Type II diabetes mellitus with complication (HCC) 06/17/2017   Hair loss    Psoriasis, unspecified 09/19/2016    Allergies  Allergen Reactions   Metformin And Related Diarrhea and Other (See Comments)    Diarrhea causing dehydration    Past Surgical History:  Procedure Laterality Date   COLONOSCOPY  05/09/2017   cleared for 10 years   TYMPANOPLASTY     repair- 60 years old    Social History   Tobacco Use   Smoking status: Never   Smokeless tobacco: Never  Vaping Use   Vaping Use: Never used  Substance Use Topics   Alcohol use: Yes    Comment: rarely   Drug use: No     Medication list has been reviewed and updated.  Current Meds  Medication Sig   atorvastatin (LIPITOR) 20 MG  tablet TAKE 1 TABLET BY MOUTH ONCE DAILY   clobetasol cream (TEMOVATE) 0.05 % SMARTSIG:Sparingly Topical Twice Daily   folic acid (FOLVITE) 1 MG tablet Take 2 mg by mouth daily.    lisinopril (ZESTRIL) 40 MG tablet TAKE 1 TABLET BY MOUTH ONCE DAILY   methotrexate 2.5 MG tablet Take 6 mg by mouth once a week. Weekly on thursdays.   sitaGLIPtin (JANUVIA) 100 MG tablet Take 1 tablet (100 mg total) by mouth daily.    PHQ 2/9 Scores 02/08/2021 10/18/2020 06/12/2020 11/24/2019  PHQ - 2 Score 0 0 0 0  PHQ- 9 Score 2 2 0 0  Exception  Documentation - - - -    GAD 7 : Generalized Anxiety Score 02/08/2021 10/18/2020 06/12/2020 11/24/2019  Nervous, Anxious, on Edge 0 0 0 0  Control/stop worrying 0 1 0 0  Worry too much - different things 0 1 0 0  Trouble relaxing 0 0 0 0  Restless 0 0 0 0  Easily annoyed or irritable 0 0 0 0  Afraid - awful might happen 0 0 0 0  Total GAD 7 Score 0 2 0 0  Anxiety Difficulty - Not difficult at all - Not difficult at all    BP Readings from Last 3 Encounters:  02/08/21 138/88  10/18/20 136/84  06/12/20 136/88    Physical Exam Vitals and nursing note reviewed.  Constitutional:      General: He is not in acute distress.    Appearance: He is well-developed.  HENT:     Head: Normocephalic and atraumatic.  Cardiovascular:     Rate and Rhythm: Normal rate and regular rhythm.     Pulses: Normal pulses.     Heart sounds: No murmur heard. Pulmonary:     Effort: Pulmonary effort is normal. No respiratory distress.  Musculoskeletal:     Cervical back: Normal range of motion.     Right lower leg: No edema.     Left lower leg: No edema.  Lymphadenopathy:     Cervical: No cervical adenopathy.  Skin:    General: Skin is warm and dry.     Findings: No rash.  Neurological:     Mental Status: He is alert and oriented to person, place, and time.  Psychiatric:        Mood and Affect: Mood normal.        Behavior: Behavior normal.    Wt Readings from Last 3 Encounters:  02/08/21 224 lb (101.6 kg)  10/18/20 226 lb (102.5 kg)  06/12/20 239 lb (108.4 kg)    BP 138/88   Pulse 72   Temp 98.2 F (36.8 C) (Oral)   Ht 6' (1.829 m)   Wt 224 lb (101.6 kg)   SpO2 98%   BMI 30.38 kg/m   Assessment and Plan: 1. Type II diabetes mellitus with complication (HCC) Clinically stable by exam and report without s/s of hypoglycemia. DM complicated by hypertension and dyslipidemia. Tolerating medications well without side effects or other concerns. Continue diet and exercise Schedule Eye exam -  POCT glycosylated hemoglobin (Hb A1C) = 6.5   2. Essential hypertension Clinically stable exam with well controlled BP. Tolerating medications without side effects at this time. Pt to continue current regimen and low sodium diet; benefits of regular exercise as able discussed.   Partially dictated using Animal nutritionist. Any errors are unintentional.  Bari Edward, MD [facility]  02/08/2021

## 2021-02-08 NOTE — Patient Instructions (Signed)
Please schedule diabetic eye exam and ask them to send me the visit note.

## 2021-03-30 ENCOUNTER — Other Ambulatory Visit: Payer: Self-pay | Admitting: Internal Medicine

## 2021-03-31 NOTE — Telephone Encounter (Signed)
Requested medication (s) are due for refill today: -  Requested medication (s) are on the active medication list: both are historical medication  Last refill:  both refilled last 06/17/2017  Future visit scheduled: yes  Notes to clinic:  historical meds   Requested Prescriptions  Pending Prescriptions Disp Refills   methotrexate 2.5 MG tablet [Pharmacy Med Name: METHOTREXATE SODIUM 2.5 MG TAB] 72 tablet     Sig: TAKE 6 TABLETS BY MOUTH ONCE WEEKLY     Not Delegated - Immunology: Immunosuppressive Agents - methotrexate Failed - 03/30/2021  1:01 PM      Failed - This refill cannot be delegated      Failed - ALT in normal range and within 90 days    ALT  Date Value Ref Range Status  06/12/2020 32 0 - 44 IU/L Final          Failed - AST in normal range and within 90 days    AST  Date Value Ref Range Status  06/12/2020 18 0 - 40 IU/L Final          Failed - Cr in normal range and within 90 days    Creatinine, Ser  Date Value Ref Range Status  06/12/2020 0.94 0.76 - 1.27 mg/dL Final          Failed - HCT in normal range and within 90 days    Hematocrit  Date Value Ref Range Status  06/12/2020 49.1 37.5 - 51.0 % Final          Failed - HGB in normal range and within 90 days    Hemoglobin  Date Value Ref Range Status  06/12/2020 16.3 13.0 - 17.7 g/dL Final          Failed - PLT in normal range and within 90 days    Platelets  Date Value Ref Range Status  06/12/2020 313 150 - 450 x10E3/uL Final          Failed - RBC in normal range and within 90 days    RBC  Date Value Ref Range Status  06/12/2020 5.50 4.14 - 5.80 x10E6/uL Final  06/05/2019 5.27 4.22 - 5.81 MIL/uL Final          Failed - WBC in normal range and within 90 days    WBC  Date Value Ref Range Status  06/12/2020 11.2 (H) 3.4 - 10.8 x10E3/uL Final  06/05/2019 6.2 4.0 - 10.5 K/uL Final          Passed - Valid encounter within last 3 months    Recent Outpatient Visits           1 month  ago Type II diabetes mellitus with complication [facility])   [facility] Reubin Milan, MD   5 months ago Type II diabetes mellitus with complication [facility])   [facility] Reubin Milan, MD   9 months ago Annual physical exam   [facility] Reubin Milan, MD   1 year ago Type II diabetes mellitus with complication [facility])   [facility] Reubin Milan, MD   1 year ago Type II diabetes mellitus with complication [facility])   [facility] Reubin Milan, MD       Future Appointments             In 2 months Judithann Graves Nyoka Cowden, MD [facility]             folic acid (FOLVITE) 1  MG tablet [Pharmacy Med Name: FOLIC ACID 1 MG TAB] 180 tablet     Sig: TAKE 2 TABLETS BY MOUTH ONCE DAILY WITH MEALS     Endocrinology:  Vitamins Passed - 03/30/2021  1:01 PM      Passed - Valid encounter within last 12 months    Recent Outpatient Visits           1 month ago Type II diabetes mellitus with complication [facility])   [facility] Reubin Milan, MD   5 months ago Type II diabetes mellitus with complication [facility])   [facility] Reubin Milan, MD   9 months ago Annual physical exam   [facility] Reubin Milan, MD   1 year ago Type II diabetes mellitus with complication [facility])   [facility] Reubin Milan, MD   1 year ago Type II diabetes mellitus with complication [facility])   [facility] Reubin Milan, MD       Future Appointments             In 2 months Judithann Graves Nyoka Cowden, MD [facility], [facility]

## 2021-04-29 LAB — BASIC METABOLIC PANEL
BUN: 12 (ref 4–21)
CO2: 25 — AB (ref 13–22)
Chloride: 104 (ref 99–108)
Creatinine: 1 (ref 0.6–1.3)
Potassium: 3.9 (ref 3.4–5.3)
Sodium: 151 — AB (ref 137–147)

## 2021-04-29 LAB — CBC AND DIFFERENTIAL
HCT: 47 (ref 41–53)
Hemoglobin: 15.8 (ref 13.5–17.5)
Platelets: 278 (ref 150–399)
WBC: 10.1

## 2021-04-29 LAB — LIPID PANEL
Cholesterol: 169 (ref 0–200)
HDL: 34 — AB (ref 35–70)
LDL Cholesterol: 105
Triglycerides: 152 (ref 40–160)

## 2021-04-29 LAB — HEPATIC FUNCTION PANEL
ALT: 38 (ref 10–40)
AST: 29 (ref 14–40)

## 2021-04-29 LAB — COMPREHENSIVE METABOLIC PANEL: GFR calc non Af Amer: 87

## 2021-05-28 ENCOUNTER — Other Ambulatory Visit: Payer: Self-pay | Admitting: Internal Medicine

## 2021-05-28 DIAGNOSIS — I10 Essential (primary) hypertension: Secondary | ICD-10-CM

## 2021-05-29 NOTE — Telephone Encounter (Signed)
Requested Prescriptions  Pending Prescriptions Disp Refills   atorvastatin (LIPITOR) 20 MG tablet [Pharmacy Med Name: ATORVASTATIN CALCIUM 20 MG TAB] 90 tablet 1    Sig: TAKE 1 TABLET BY MOUTH ONCE DAILY     Cardiovascular:  Antilipid - Statins Failed - 05/28/2021  3:28 PM      Failed - LDL in normal range and within 360 days    LDL Chol Calc (NIH)  Date Value Ref Range Status  06/12/2020 106 (H) 0 - 99 mg/dL Final         Failed - HDL in normal range and within 360 days    HDL  Date Value Ref Range Status  06/12/2020 37 (L) >39 mg/dL Final         Failed - Triglycerides in normal range and within 360 days    Triglycerides  Date Value Ref Range Status  06/12/2020 161 (H) 0 - 149 mg/dL Final         Passed - Total Cholesterol in normal range and within 360 days    Cholesterol, Total  Date Value Ref Range Status  06/12/2020 171 100 - 199 mg/dL Final         Passed - Patient is not pregnant      Passed - Valid encounter within last 12 months    Recent Outpatient Visits          3 months ago Type II diabetes mellitus with complication [facility])   [facility] Reubin Milan, MD   7 months ago Type II diabetes mellitus with complication [facility])   [facility] Reubin Milan, MD   11 months ago Annual physical exam   [facility] Reubin Milan, MD   1 year ago Type II diabetes mellitus with complication [facility])   [facility] Reubin Milan, MD   1 year ago Type II diabetes mellitus with complication [facility])   [facility] Reubin Milan, MD      Future Appointments            In 2 weeks Reubin Milan, MD [facility]            lisinopril (ZESTRIL) 40 MG tablet [Pharmacy Med Name: LISINOPRIL 40 MG TAB] 90 tablet 0    Sig: TAKE 1 TABLET BY MOUTH ONCE DAILY     Cardiovascular:  ACE Inhibitors Failed - 05/28/2021  3:28 PM      Failed - Cr in normal range and within 180 days    Creatinine, Ser   Date Value Ref Range Status  06/12/2020 0.94 0.76 - 1.27 mg/dL Final         Failed - K in normal range and within 180 days    Potassium  Date Value Ref Range Status  06/12/2020 4.5 3.5 - 5.2 mmol/L Final         Passed - Patient is not pregnant      Passed - Last BP in normal range    BP Readings from Last 1 Encounters:  02/08/21 138/88         Passed - Valid encounter within last 6 months    Recent Outpatient Visits          3 months ago Type II diabetes mellitus with complication [facility])   [facility] Reubin Milan, MD   7 months ago Type II diabetes mellitus with complication [facility])   [facility] Reubin Milan, MD  11 months ago Annual physical exam   [facility] Reubin Milan, MD   1 year ago Type II diabetes mellitus with complication [facility])   [facility] Reubin Milan, MD   1 year ago Type II diabetes mellitus with complication [facility])   [facility] Reubin Milan, MD      Future Appointments            In 2 weeks Judithann Graves Nyoka Cowden, MD [facility], [facility]

## 2021-06-16 ENCOUNTER — Other Ambulatory Visit: Payer: Self-pay | Admitting: Internal Medicine

## 2021-06-17 ENCOUNTER — Other Ambulatory Visit: Payer: Self-pay

## 2021-06-17 ENCOUNTER — Ambulatory Visit (INDEPENDENT_AMBULATORY_CARE_PROVIDER_SITE_OTHER): Payer: Managed Care, Other (non HMO) | Admitting: Internal Medicine

## 2021-06-17 ENCOUNTER — Encounter: Payer: Self-pay | Admitting: Internal Medicine

## 2021-06-17 VITALS — BP 116/70 | HR 90 | Ht 72.0 in | Wt 233.0 lb

## 2021-06-17 DIAGNOSIS — Z0000 Encounter for general adult medical examination without abnormal findings: Secondary | ICD-10-CM | POA: Diagnosis not present

## 2021-06-17 DIAGNOSIS — I10 Essential (primary) hypertension: Secondary | ICD-10-CM

## 2021-06-17 DIAGNOSIS — L4050 Arthropathic psoriasis, unspecified: Secondary | ICD-10-CM | POA: Diagnosis not present

## 2021-06-17 DIAGNOSIS — E785 Hyperlipidemia, unspecified: Secondary | ICD-10-CM | POA: Diagnosis not present

## 2021-06-17 DIAGNOSIS — E1169 Type 2 diabetes mellitus with other specified complication: Secondary | ICD-10-CM

## 2021-06-17 DIAGNOSIS — Z125 Encounter for screening for malignant neoplasm of prostate: Secondary | ICD-10-CM | POA: Diagnosis not present

## 2021-06-17 DIAGNOSIS — E118 Type 2 diabetes mellitus with unspecified complications: Secondary | ICD-10-CM

## 2021-06-17 MED ORDER — SITAGLIPTIN PHOSPHATE 100 MG PO TABS: 100.0000 mg | ORAL_TABLET | Freq: Every day | ORAL | 3 refills | Status: DC

## 2021-06-17 MED ORDER — ATORVASTATIN CALCIUM 40 MG PO TABS: 40.0000 mg | ORAL_TABLET | Freq: Every day | ORAL | 1 refills | Status: DC

## 2021-06-17 NOTE — Patient Instructions (Addendum)
Take 2 Lipitor 20 mg and I will change the Rx to 40 mg for the next fill.  Please schedule your diabetic eye exam.  Due annually.

## 2021-06-17 NOTE — Progress Notes (Signed)
Date:  06/17/2021   Name:  Russell Wright   DOB:  October 06, 1960   MRN:  [id number]   Chief Complaint: Annual Exam (Micro. Foot exam.) Russell Wright is a 61 y.o. male who presents today for his Complete Annual Exam. He feels fairly well. He reports exercising. He reports he is sleeping poorly intermittently.  Colonoscopy: 05/2016 repeat 2027  Immunization History  Administered Date(s) Administered   Influenza,inj,Quad PF,6+ Mos 06/22/2018, 03/28/2019, 04/05/2020, 02/08/2021   Influenza,inj,quad, With Preservative 06/10/2015   Influenza-Unspecified 04/09/2017   Moderna Sars-Covid-2 Vaccination 09/02/2019, 09/29/2019, 04/20/2020, 09/12/2020, 05/17/2021   Pneumococcal Polysaccharide-23 03/28/2019   Tdap 04/16/2015    Lab Results  Component Value Date   PSA1 0.8 06/12/2020   PSA1 0.8 03/28/2019   PSA1 0.5 03/17/2018   PSA 0.6 04/22/2017    Diabetes He presents for his follow-up diabetic visit. He has type 2 diabetes mellitus. Pertinent negatives for hypoglycemia include no dizziness, headaches or nervousness/anxiousness. Pertinent negatives for diabetes include no chest pain and no fatigue. Symptoms are stable. Current diabetic treatment includes oral agent (monotherapy) Celesta Gentile). He participates in exercise daily. An ACE inhibitor/angiotensin II receptor blocker is being taken. Eye exam is not current.  Hypertension This is a chronic problem. The problem is controlled. Pertinent negatives include no chest pain, headaches, palpitations or shortness of breath. Past treatments include ACE inhibitors. The current treatment provides significant improvement.  Hyperlipidemia This is a chronic problem. The problem is controlled. Pertinent negatives include no chest pain, myalgias or shortness of breath. Current antihyperlipidemic treatment includes statins. The current treatment provides moderate improvement of lipids.   Lab Results  Component Value Date   NA 151 (A) 04/29/2021   K 3.9  04/29/2021   CO2 25 (A) 04/29/2021   GLUCOSE 149 (H) 06/12/2020   BUN 12 04/29/2021   CREATININE 1.0 04/29/2021   CALCIUM 10.2 06/12/2020   GFRNONAA 87 04/29/2021   Lab Results  Component Value Date   CHOL 169 04/29/2021   HDL 34 (A) 04/29/2021   LDLCALC 105 04/29/2021   TRIG 152 04/29/2021   CHOLHDL 4.6 06/12/2020   No results found for: TSH Lab Results  Component Value Date   HGBA1C 6.5 (A) 02/08/2021   Lab Results  Component Value Date   WBC 10.1 04/29/2021   HGB 15.8 04/29/2021   HCT 47 04/29/2021   MCV 89 06/12/2020   PLT 278 04/29/2021   Lab Results  Component Value Date   ALT 38 04/29/2021   AST 29 04/29/2021   ALKPHOS 86 06/12/2020   BILITOT 0.3 06/12/2020   No results found for: 25OHVITD2, 25OHVITD3, VD25OH   Review of Systems  Constitutional:  Negative for appetite change, chills, diaphoresis, fatigue and unexpected weight change.  HENT:  Negative for hearing loss, tinnitus, trouble swallowing and voice change.   Eyes:  Negative for visual disturbance.  Respiratory:  Negative for choking, shortness of breath and wheezing.   Cardiovascular:  Negative for chest pain, palpitations and leg swelling.  Gastrointestinal:  Negative for abdominal pain, blood in stool, constipation and diarrhea.  Genitourinary:  Negative for difficulty urinating, dysuria and frequency.  Musculoskeletal:  Negative for arthralgias, back pain and myalgias.  Skin:  Negative for color change and rash.  Neurological:  Negative for dizziness, syncope and headaches.  Hematological:  Negative for adenopathy.  Psychiatric/Behavioral:  Positive for sleep disturbance (often falls asleep on the couch then can not go back to sleep). Negative for dysphoric mood. The patient is not nervous/anxious.  Patient Active Problem List   Diagnosis Date Noted   Sleep disturbance 09/15/2017   Psoriatic arthritis ([place]) 06/17/2017   Essential hypertension 06/17/2017   Hyperlipidemia associated with type  2 diabetes mellitus ([place]) 06/17/2017   Type II diabetes mellitus with complication ([place]) 0000000   Hair loss    Psoriasis, unspecified 09/19/2016    Allergies  Allergen Reactions   Metformin And Related Diarrhea and Other (See Comments)    Diarrhea causing dehydration    Past Surgical History:  Procedure Laterality Date   COLONOSCOPY  05/09/2017   cleared for 10 years   TYMPANOPLASTY     repair- 61 years old    Social History   Tobacco Use   Smoking status: Never   Smokeless tobacco: Never  Vaping Use   Vaping Use: Never used  Substance Use Topics   Alcohol use: Yes    Comment: rarely   Drug use: No     Medication list has been reviewed and updated.  Current Meds  Medication Sig   atorvastatin (LIPITOR) 20 MG tablet TAKE 1 TABLET BY MOUTH ONCE DAILY   clobetasol cream (TEMOVATE) 0.05 % SMARTSIG:Sparingly Topical Twice Daily   folic acid (FOLVITE) 1 MG tablet TAKE 2 TABLETS BY MOUTH ONCE DAILY WITH MEALS   lisinopril (ZESTRIL) 40 MG tablet TAKE 1 TABLET BY MOUTH ONCE DAILY   methotrexate 2.5 MG tablet TAKE 6 TABLETS BY MOUTH ONCE WEEKLY   sitaGLIPtin (JANUVIA) 100 MG tablet Take 1 tablet (100 mg total) by mouth daily.    PHQ 2/9 Scores 06/17/2021 02/08/2021 10/18/2020 06/12/2020  PHQ - 2 Score 0 0 0 0  PHQ- 9 Score 0 2 2 0  Exception Documentation - - - -    GAD 7 : Generalized Anxiety Score 06/17/2021 02/08/2021 10/18/2020 06/12/2020  Nervous, Anxious, on Edge 0 0 0 0  Control/stop worrying 1 0 1 0  Worry too much - different things 1 0 1 0  Trouble relaxing 1 0 0 0  Restless 0 0 0 0  Easily annoyed or irritable 0 0 0 0  Afraid - awful might happen 0 0 0 0  Total GAD 7 Score 3 0 2 0  Anxiety Difficulty Not difficult at all - Not difficult at all -    BP Readings from Last 3 Encounters:  06/17/21 116/70  02/08/21 138/88  10/18/20 136/84    Physical Exam Vitals and nursing note reviewed.  Constitutional:      Appearance: Normal appearance. He is  well-developed.  HENT:     Head: Normocephalic.     Right Ear: Tympanic membrane, ear canal and external ear normal.     Left Ear: Tympanic membrane, ear canal and external ear normal.     Nose: Nose normal.  Eyes:     Conjunctiva/sclera: Conjunctivae normal.     Pupils: Pupils are equal, round, and reactive to light.  Neck:     Thyroid: No thyromegaly.     Vascular: No carotid bruit.  Cardiovascular:     Rate and Rhythm: Normal rate and regular rhythm.     Heart sounds: Normal heart sounds.  Pulmonary:     Effort: Pulmonary effort is normal.     Breath sounds: Normal breath sounds. No wheezing.  Chest:  Breasts:    Right: No mass.     Left: No mass.  Abdominal:     General: Bowel sounds are normal.     Palpations: Abdomen is soft.     Tenderness: There  is no abdominal tenderness.  Musculoskeletal:        General: Normal range of motion.     Cervical back: Normal range of motion and neck supple.  Lymphadenopathy:     Cervical: No cervical adenopathy.  Skin:    General: Skin is warm and dry.     Capillary Refill: Capillary refill takes less than 2 seconds.  Neurological:     General: No focal deficit present.     Mental Status: He is alert and oriented to person, place, and time.     Deep Tendon Reflexes: Reflexes are normal and symmetric.  Psychiatric:        Attention and Perception: Attention normal.        Mood and Affect: Mood normal.        Thought Content: Thought content normal.    Wt Readings from Last 3 Encounters:  06/17/21 233 lb (105.7 kg)  02/08/21 224 lb (101.6 kg)  10/18/20 226 lb (102.5 kg)    BP 116/70    Pulse 90    Ht 6' (1.829 m)    Wt 233 lb (105.7 kg)    SpO2 95%    BMI 31.60 kg/m   Assessment and Plan: 1. Annual physical exam Exam is normal except for weight. Encourage regular exercise and appropriate dietary changes.  He has started his exercise anew. Recommend adjusting sleep pattern to go to sleep in bed before falling asleep with the  TV.   Up to date on screenings and immunizations. Declines Shingrix for now.  2. Prostate cancer screening DRE deferred - PSA  3. Essential hypertension Clinically stable exam with well controlled BP. Tolerating medications without side effects at this time. Pt to continue current regimen and low sodium diet; benefits of regular exercise as able discussed.  4. Hyperlipidemia associated with type 2 diabetes mellitus ([place]) Not at goal. Increase to 40 mg Atorvastatin daily. - atorvastatin (LIPITOR) 40 MG tablet; Take 1 tablet (40 mg total) by mouth daily.  Dispense: 90 tablet; Refill: 1  5. Type II diabetes mellitus with complication (HCC) Clinically stable by exam and report without s/s of hypoglycemia. DM complicated by hypertension and dyslipidemia. Tolerating medications well without side effects or other concerns. Schedule Eye exam. - Microalbumin / creatinine urine ratio - Hemoglobin A1c - sitaGLIPtin (JANUVIA) 100 MG tablet; Take 1 tablet (100 mg total) by mouth daily.  Dispense: 90 tablet; Refill: 3  6. Psoriatic arthritis ([place]) Controlled on MTX   Partially dictated using Editor, commissioning. Any errors are unintentional.  Halina Maidens, MD [facility]  06/17/2021

## 2021-06-18 ENCOUNTER — Telehealth: Payer: Self-pay | Admitting: Internal Medicine

## 2021-06-18 LAB — MICROALBUMIN / CREATININE URINE RATIO
Creatinine, Urine: 212 mg/dL
Microalb/Creat Ratio: 23 mg/g creat (ref 0–29)
Microalbumin, Urine: 49.1 ug/mL

## 2021-06-18 LAB — HEMOGLOBIN A1C
Est. average glucose Bld gHb Est-mCnc: 169 mg/dL
Hgb A1c MFr Bld: 7.5 % — ABNORMAL HIGH (ref 4.8–5.6)

## 2021-06-18 LAB — PSA: Prostate Specific Ag, Serum: 0.8 ng/mL (ref 0.0–4.0)

## 2021-07-22 ENCOUNTER — Other Ambulatory Visit: Payer: Self-pay | Admitting: Internal Medicine

## 2021-07-22 DIAGNOSIS — I10 Essential (primary) hypertension: Secondary | ICD-10-CM

## 2021-07-23 NOTE — Telephone Encounter (Signed)
Requested Prescriptions  Pending Prescriptions Disp Refills   lisinopril (ZESTRIL) 40 MG tablet [Pharmacy Med Name: LISINOPRIL 40 MG TAB] 90 tablet 0    Sig: TAKE 1 TABLET BY MOUTH ONCE DAILY     Cardiovascular:  ACE Inhibitors Passed - 07/22/2021  2:08 PM      Passed - Cr in normal range and within 180 days    Creatinine  Date Value Ref Range Status  04/29/2021 1.0 0.6 - 1.3 Final   Creatinine, Ser  Date Value Ref Range Status  06/12/2020 0.94 0.76 - 1.27 mg/dL Final         Passed - K in normal range and within 180 days    Potassium  Date Value Ref Range Status  04/29/2021 3.9 3.4 - 5.3 Final         Passed - Patient is not pregnant      Passed - Last BP in normal range    BP Readings from Last 1 Encounters:  06/17/21 116/70         Passed - Valid encounter within last 6 months    Recent Outpatient Visits          1 month ago Annual physical exam   [facility] Glean Hess, MD   5 months ago Type II diabetes mellitus with complication [facility])   [facility] Glean Hess, MD   9 months ago Type II diabetes mellitus with complication [facility])   [facility] Glean Hess, MD   1 year ago Annual physical exam   [facility] Glean Hess, MD   1 year ago Type II diabetes mellitus with complication [facility])   [facility] Glean Hess, MD      Future Appointments            In 2 months Army Melia Jesse Sans, MD [facility], [facility]

## 2021-09-11 LAB — HM DIABETES EYE EXAM

## 2021-10-11 ENCOUNTER — Other Ambulatory Visit: Payer: Self-pay | Admitting: Internal Medicine

## 2021-10-11 DIAGNOSIS — I10 Essential (primary) hypertension: Secondary | ICD-10-CM

## 2021-10-11 NOTE — Telephone Encounter (Signed)
Requested Prescriptions  ?Pending Prescriptions Disp Refills  ?? lisinopril (ZESTRIL) 40 MG tablet [Pharmacy Med Name: LISINOPRIL 40 MG TAB] 90 tablet 0  ?  Sig: TAKE 1 TABLET BY MOUTH ONCE DAILY  ?  ? Cardiovascular:  ACE Inhibitors Passed - 10/11/2021  5:27 PM  ?  ?  Passed - Cr in normal range and within 180 days  ?  Creatinine  ?Date Value Ref Range Status  ?04/29/2021 1.0 0.6 - 1.3 Final  ? ?Creatinine, Ser  ?Date Value Ref Range Status  ?06/12/2020 0.94 0.76 - 1.27 mg/dL Final  ?   ?  ?  Passed - K in normal range and within 180 days  ?  Potassium  ?Date Value Ref Range Status  ?04/29/2021 3.9 3.4 - 5.3 Final  ?   ?  ?  Passed - Patient is not pregnant  ?  ?  Passed - Last BP in normal range  ?  BP Readings from Last 1 Encounters:  ?06/17/21 116/70  ?   ?  ?  Passed - Valid encounter within last 6 months  ?  Recent Outpatient Visits   ?      ? 3 months ago Annual physical exam  ? [facility] Glean Hess, MD  ? 8 months ago Type II diabetes mellitus with complication [facility])  ? [facility] Glean Hess, MD  ? 11 months ago Type II diabetes mellitus with complication [facility])  ? [facility] Glean Hess, MD  ? 1 year ago Annual physical exam  ? [facility] Glean Hess, MD  ? 1 year ago Type II diabetes mellitus with complication [facility])  ? [facility] Glean Hess, MD  ?  ?  ?Future Appointments   ?        ? In 4 days Glean Hess, MD [facility], [place]  ?  ? ?  ?  ?  ? ? ?

## 2021-10-15 ENCOUNTER — Encounter: Payer: Self-pay | Admitting: Internal Medicine

## 2021-10-15 ENCOUNTER — Ambulatory Visit: Payer: Managed Care, Other (non HMO) | Admitting: Internal Medicine

## 2021-10-15 VITALS — BP 112/82 | HR 75 | Ht 72.0 in | Wt 233.0 lb

## 2021-10-15 DIAGNOSIS — I10 Essential (primary) hypertension: Secondary | ICD-10-CM

## 2021-10-15 DIAGNOSIS — E118 Type 2 diabetes mellitus with unspecified complications: Secondary | ICD-10-CM | POA: Diagnosis not present

## 2021-10-15 LAB — POCT GLYCOSYLATED HEMOGLOBIN (HGB A1C): Hemoglobin A1C: 8 % — AB (ref 4.0–5.6)

## 2021-10-15 MED ORDER — EMPAGLIFLOZIN 25 MG PO TABS: 25.0000 mg | ORAL_TABLET | Freq: Every day | ORAL | 1 refills | Status: DC

## 2021-10-15 NOTE — Progress Notes (Signed)
? ? ?Date:  10/15/2021  ? ?Name:  Russell Wright   DOB:  05-19-1961   MRN:  [id number] ? ? ?Chief Complaint: Diabetes and Hypertension ? ?Diabetes ?He presents for his follow-up diabetic visit. He has type 2 diabetes mellitus. Pertinent negatives for hypoglycemia include no headaches or tremors. Pertinent negatives for diabetes include no chest pain, no fatigue, no polydipsia and no polyuria. Symptoms are stable. Pertinent negatives for diabetic complications include no CVA, heart disease, nephropathy or PVD. Current diabetic treatment includes oral agent (monotherapy) Alma Friendly). He is compliant with treatment all of the time.  ?Hypertension ?This is a chronic problem. The problem is controlled. Pertinent negatives include no chest pain, headaches, palpitations or shortness of breath. Past treatments include ACE inhibitors. There is no history of kidney disease, CAD/MI, CVA or PVD.  ? ?Lab Results  ?Component Value Date  ? NA 151 (A) 04/29/2021  ? K 3.9 04/29/2021  ? CO2 25 (A) 04/29/2021  ? GLUCOSE 149 (H) 06/12/2020  ? BUN 12 04/29/2021  ? CREATININE 1.0 04/29/2021  ? CALCIUM 10.2 06/12/2020  ? GFRNONAA 87 04/29/2021  ? ?Lab Results  ?Component Value Date  ? CHOL 169 04/29/2021  ? HDL 34 (A) 04/29/2021  ? LDLCALC 105 04/29/2021  ? TRIG 152 04/29/2021  ? CHOLHDL 4.6 06/12/2020  ? ?No results found for: TSH ?Lab Results  ?Component Value Date  ? HGBA1C 7.5 (H) 06/17/2021  ? ?Lab Results  ?Component Value Date  ? WBC 10.1 04/29/2021  ? HGB 15.8 04/29/2021  ? HCT 47 04/29/2021  ? MCV 89 06/12/2020  ? PLT 278 04/29/2021  ? ?Lab Results  ?Component Value Date  ? ALT 38 04/29/2021  ? AST 29 04/29/2021  ? ALKPHOS 86 06/12/2020  ? BILITOT 0.3 06/12/2020  ? ?No results found for: 25OHVITD2, 25OHVITD3, VD25OH  ? ?Review of Systems  ?Constitutional:  Negative for appetite change, fatigue and unexpected weight change.  ?Eyes:  Negative for visual disturbance.  ?Respiratory:  Negative for cough, shortness of breath and wheezing.    ?Cardiovascular:  Negative for chest pain, palpitations and leg swelling.  ?Gastrointestinal:  Negative for abdominal pain and blood in stool.  ?Endocrine: Negative for polydipsia and polyuria.  ?Genitourinary:  Negative for dysuria and hematuria.  ?Skin:  Negative for color change and rash.  ?Neurological:  Negative for tremors, numbness and headaches.  ?Psychiatric/Behavioral:  Negative for dysphoric mood.   ? ?Patient Active Problem List  ? Diagnosis Date Noted  ? Sleep disturbance 09/15/2017  ? Psoriatic arthritis (HCC) 06/17/2017  ? Essential hypertension 06/17/2017  ? Hyperlipidemia associated with type 2 diabetes mellitus (HCC) 06/17/2017  ? Type II diabetes mellitus with complication (HCC) 06/17/2017  ? Hair loss   ? Psoriasis, unspecified 09/19/2016  ? ? ?Allergies  ?Allergen Reactions  ? Metformin And Related Diarrhea and Other (See Comments)  ?  Diarrhea causing dehydration  ? ? ?Past Surgical History:  ?Procedure Laterality Date  ? COLONOSCOPY  05/09/2017  ? cleared for 10 years  ? TYMPANOPLASTY    ? repair- 61 years old  ? ? ?Social History  ? ?Tobacco Use  ? Smoking status: Never  ? Smokeless tobacco: Never  ?Vaping Use  ? Vaping Use: Never used  ?Substance Use Topics  ? Alcohol use: Yes  ?  Comment: rarely  ? Drug use: No  ? ? ? ?Medication list has been reviewed and updated. ? ?Current Meds  ?Medication Sig  ? atorvastatin (LIPITOR) 40 MG tablet Take 1 tablet (  40 mg total) by mouth daily.  ? clobetasol cream (TEMOVATE) 0.05 % SMARTSIG:Sparingly Topical Twice Daily  ? folic acid (FOLVITE) 1 MG tablet TAKE 2 TABLETS BY MOUTH ONCE DAILY WITH MEALS  ? lisinopril (ZESTRIL) 40 MG tablet TAKE 1 TABLET BY MOUTH ONCE DAILY  ? methotrexate 2.5 MG tablet TAKE 6 TABLETS BY MOUTH ONCE WEEKLY  ? sitaGLIPtin (JANUVIA) 100 MG tablet Take 1 tablet (100 mg total) by mouth daily.  ? ? ? ?  10/15/2021  ?  9:45 AM 06/17/2021  ?  8:06 AM 02/08/2021  ?  8:28 AM 10/18/2020  ?  8:40 AM  ?GAD 7 : Generalized Anxiety Score   ?Nervous, Anxious, on Edge 1 0 0 0  ?Control/stop worrying 1 1 0 1  ?Worry too much - different things 1 1 0 1  ?Trouble relaxing 1 1 0 0  ?Restless 1 0 0 0  ?Easily annoyed or irritable 1 0 0 0  ?Afraid - awful might happen 0 0 0 0  ?Total GAD 7 Score 6 3 0 2  ?Anxiety Difficulty Not difficult at all Not difficult at all  Not difficult at all  ? ? ? ?  10/15/2021  ?  9:45 AM  ?Depression screen PHQ 2/9  ?Decreased Interest 1  ?Down, Depressed, Hopeless 1  ?PHQ - 2 Score 2  ?Altered sleeping 2  ?Tired, decreased energy 0  ?Change in appetite 1  ?Feeling bad or failure about yourself  0  ?Trouble concentrating 1  ?Moving slowly or fidgety/restless 0  ?Suicidal thoughts 0  ?PHQ-9 Score 6  ?Difficult doing work/chores Not difficult at all  ? ? ?BP Readings from Last 3 Encounters:  ?10/15/21 128/88  ?06/17/21 116/70  ?02/08/21 138/88  ? ? ?Physical Exam ?Vitals and nursing note reviewed.  ?Constitutional:   ?   General: He is not in acute distress. ?   Appearance: He is well-developed.  ?HENT:  ?   Head: Normocephalic and atraumatic.  ?Cardiovascular:  ?   Rate and Rhythm: Normal rate and regular rhythm.  ?   Pulses: Normal pulses.  ?   Heart sounds: No murmur heard. ?Pulmonary:  ?   Effort: Pulmonary effort is normal. No respiratory distress.  ?   Breath sounds: No wheezing or rhonchi.  ?Musculoskeletal:  ?   Cervical back: Normal range of motion.  ?   Right lower leg: No edema.  ?   Left lower leg: No edema.  ?Lymphadenopathy:  ?   Cervical: No cervical adenopathy.  ?Skin: ?   General: Skin is warm and dry.  ?   Capillary Refill: Capillary refill takes less than 2 seconds.  ?   Findings: No rash.  ?Neurological:  ?   General: No focal deficit present.  ?   Mental Status: He is alert and oriented to person, place, and time.  ?Psychiatric:     ?   Mood and Affect: Mood normal.     ?   Behavior: Behavior normal.  ? ? ?Wt Readings from Last 3 Encounters:  ?10/15/21 233 lb (105.7 kg)  ?06/17/21 233 lb (105.7 kg)  ?02/08/21  224 lb (101.6 kg)  ? ? ?BP 128/88   Pulse 75   Ht 6' (1.829 m)   Wt 233 lb (105.7 kg)   SpO2 95%   BMI 31.60 kg/m?  ? ?Assessment and Plan: ?1. Type II diabetes mellitus with complication (HCC) ?BS is not controlled - A1C is higher despite medication and diet compliance. ?Recommend  continue working on weight loss. ?Will add Jardiance 25 mg per day. ?- POCT glycosylated hemoglobin (Hb A1C)= 8.0 up from 7.5 ?- empagliflozin (JARDIANCE) 25 MG TABS tablet; Take 1 tablet (25 mg total) by mouth daily before breakfast.  Dispense: 90 tablet; Refill: 1 ? ?2. Essential hypertension ?Clinically stable exam with well controlled BP. ?Tolerating medications without side effects at this time. ?Pt to continue current regimen and low sodium diet; benefits of regular exercise as able discussed. ? ? ? ?Partially dictated using Animal nutritionist. Any errors are unintentional. ? ?Bari Edward, MD ?[facility] ?[facility] ? ?10/15/2021 ? ? ? ? ?

## 2021-10-29 ENCOUNTER — Encounter: Payer: Self-pay | Admitting: Internal Medicine

## 2021-11-14 ENCOUNTER — Other Ambulatory Visit: Payer: Self-pay | Admitting: Internal Medicine

## 2021-11-14 DIAGNOSIS — E1169 Type 2 diabetes mellitus with other specified complication: Secondary | ICD-10-CM

## 2021-11-14 NOTE — Telephone Encounter (Signed)
Requested Prescriptions  Pending Prescriptions Disp Refills  . atorvastatin (LIPITOR) 40 MG tablet [Pharmacy Med Name: ATORVASTATIN CALCIUM 40 MG TAB] 90 tablet 1    Sig: TAKE 1 TABLET BY MOUTH ONCE DAILY     Cardiovascular:  Antilipid - Statins Failed - 11/14/2021  8:15 AM      Failed - Lipid Panel in normal range within the last 12 months    Cholesterol, Total  Date Value Ref Range Status  06/12/2020 171 100 - 199 mg/dL Final   Cholesterol  Date Value Ref Range Status  04/29/2021 169 0 - 200 Final   LDL Chol Calc (NIH)  Date Value Ref Range Status  06/12/2020 106 (H) 0 - 99 mg/dL Final   LDL Cholesterol  Date Value Ref Range Status  04/29/2021 105  Final   HDL  Date Value Ref Range Status  04/29/2021 34 (A) 35 - 70 Final  06/12/2020 37 (L) >39 mg/dL Final   Triglycerides  Date Value Ref Range Status  04/29/2021 152 40 - 160 Final         Passed - Patient is not pregnant      Passed - Valid encounter within last 12 months    Recent Outpatient Visits          1 month ago Type II diabetes mellitus with complication [facility])   [facility] Glean Hess, MD   5 months ago Annual physical exam   [facility] Glean Hess, MD   9 months ago Type II diabetes mellitus with complication [facility])   [facility] Glean Hess, MD   1 year ago Type II diabetes mellitus with complication [facility])   [facility] Glean Hess, MD   1 year ago Annual physical exam   [facility] Glean Hess, MD      Future Appointments            In 3 months Army Melia Jesse Sans, MD [facility], [facility]

## 2021-12-17 ENCOUNTER — Telehealth: Payer: Self-pay

## 2021-12-17 NOTE — Telephone Encounter (Signed)
Copied from CRM [id number]. Topic: General - Inquiry >> Dec 17, 2021  8:53 AM De Blanch wrote: Reason for CRM: A prescription card getting ready to expire needs an updated one to get a prescription.Pt wife stated that Dr.Berglund has helped him get this to pay for his medication.  Pt wife mentioned that the names of three medications are listed at the top of the card.   Please advise.

## 2021-12-17 NOTE — Telephone Encounter (Signed)
Called and informed prescription savings card ready for pick up.  - Betzy Barbier

## 2022-01-10 ENCOUNTER — Other Ambulatory Visit: Payer: Self-pay | Admitting: Internal Medicine

## 2022-01-10 DIAGNOSIS — I10 Essential (primary) hypertension: Secondary | ICD-10-CM

## 2022-01-10 NOTE — Telephone Encounter (Signed)
Requested medication (s) are due for refill today: yes  Requested medication (s) are on the active medication list: yes  Last refill:  10/11/21 #90/0  Future visit scheduled: yes  Notes to clinic:  Unable to refill per protocol due to failed labs, no updated results.      Requested Prescriptions  Pending Prescriptions Disp Refills   lisinopril (ZESTRIL) 40 MG tablet [Pharmacy Med Name: lisinopril 40 mg tablet] 90 tablet 0    Sig: TAKE 1 TABLET BY MOUTH ONCE DAILY     Cardiovascular:  ACE Inhibitors Failed - 01/10/2022  1:12 PM      Failed - Cr in normal range and within 180 days    Creatinine  Date Value Ref Range Status  04/29/2021 1.0 0.6 - 1.3 Final   Creatinine, Ser  Date Value Ref Range Status  06/12/2020 0.94 0.76 - 1.27 mg/dL Final         Failed - K in normal range and within 180 days    Potassium  Date Value Ref Range Status  04/29/2021 3.9 3.4 - 5.3 Final         Passed - Patient is not pregnant      Passed - Last BP in normal range    BP Readings from Last 1 Encounters:  10/15/21 112/82         Passed - Valid encounter within last 6 months    Recent Outpatient Visits           2 months ago Type II diabetes mellitus with complication [facility])   [facility] Reubin Milan, MD   6 months ago Annual physical exam   [facility] Reubin Milan, MD   11 months ago Type II diabetes mellitus with complication [facility])   [facility] Reubin Milan, MD   1 year ago Type II diabetes mellitus with complication [facility])   [facility] Reubin Milan, MD   1 year ago Annual physical exam   [facility] Reubin Milan, MD       Future Appointments             In 1 month Judithann Graves Nyoka Cowden, MD [facility], [facility]

## 2022-02-19 ENCOUNTER — Encounter: Payer: Self-pay | Admitting: Internal Medicine

## 2022-02-19 ENCOUNTER — Ambulatory Visit: Payer: Managed Care, Other (non HMO) | Admitting: Internal Medicine

## 2022-02-19 VITALS — BP 118/72 | HR 90 | Ht 72.0 in | Wt 223.0 lb

## 2022-02-19 DIAGNOSIS — E118 Type 2 diabetes mellitus with unspecified complications: Secondary | ICD-10-CM

## 2022-02-19 DIAGNOSIS — Z23 Encounter for immunization: Secondary | ICD-10-CM

## 2022-02-19 DIAGNOSIS — I10 Essential (primary) hypertension: Secondary | ICD-10-CM | POA: Diagnosis not present

## 2022-02-19 MED ORDER — GLYXAMBI 25-5 MG PO TABS: 1.0000 | ORAL_TABLET | Freq: Every day | ORAL | 0 refills | Status: DC

## 2022-02-19 NOTE — Progress Notes (Signed)
Date:  02/19/2022   Name:  Russell Wright   DOB:  [birth year]   MRN:  [id number]   Chief Complaint: Diabetes and Hypertension  Diabetes He presents for his follow-up diabetic visit. He has type 2 diabetes mellitus. Pertinent negatives for hypoglycemia include no headaches or tremors. Pertinent negatives for diabetes include no chest pain, no fatigue, no polydipsia and no polyuria. Current diabetic treatment includes oral agent (monotherapy) Alma Friendly, jardiance added last visit).  He is paying $45/mo for Januvia.  He has a $10 card for Jardiance.  Lab Results  Component Value Date   NA 151 (A) 04/29/2021   K 3.9 04/29/2021   CO2 25 (A) 04/29/2021   GLUCOSE 149 (H) 06/12/2020   BUN 12 04/29/2021   CREATININE 1.0 04/29/2021   CALCIUM 10.2 06/12/2020   GFRNONAA 87 04/29/2021   Lab Results  Component Value Date   CHOL 169 04/29/2021   HDL 34 (A) 04/29/2021   LDLCALC 105 04/29/2021   TRIG 152 04/29/2021   CHOLHDL 4.6 06/12/2020   No results found for: "TSH" Lab Results  Component Value Date   HGBA1C 8.0 (A) 10/15/2021   Lab Results  Component Value Date   WBC 10.1 04/29/2021   HGB 15.8 04/29/2021   HCT 47 04/29/2021   MCV 89 06/12/2020   PLT 278 04/29/2021   Lab Results  Component Value Date   ALT 38 04/29/2021   AST 29 04/29/2021   ALKPHOS 86 06/12/2020   BILITOT 0.3 06/12/2020   No results found for: "25OHVITD2", "25OHVITD3", "VD25OH"   Review of Systems  Constitutional:  Negative for appetite change, fatigue and unexpected weight change.  Eyes:  Negative for visual disturbance.  Respiratory:  Negative for cough, shortness of breath and wheezing.   Cardiovascular:  Negative for chest pain, palpitations and leg swelling.  Gastrointestinal:  Negative for abdominal pain and blood in stool.  Endocrine: Negative for polydipsia and polyuria.  Genitourinary:  Negative for dysuria and hematuria.       Nocturia  Skin:  Negative for color change and rash.   Neurological:  Negative for tremors, numbness and headaches.  Psychiatric/Behavioral:  Negative for dysphoric mood.     Patient Active Problem List   Diagnosis Date Noted   Sleep disturbance 09/15/2017   Psoriatic arthritis (HCC) 06/17/2017   Essential hypertension 06/17/2017   Hyperlipidemia associated with type 2 diabetes mellitus (HCC) 06/17/2017   Type II diabetes mellitus with complication (HCC) 06/17/2017   Hair loss    Psoriasis, unspecified 09/19/2016    Allergies  Allergen Reactions   Metformin And Related Diarrhea and Other (See Comments)    Diarrhea causing dehydration    Past Surgical History:  Procedure Laterality Date   COLONOSCOPY  05/09/2017   cleared for 10 years   TYMPANOPLASTY     repair- 61 years old    Social History   Tobacco Use   Smoking status: Never   Smokeless tobacco: Never  Vaping Use   Vaping Use: Never used  Substance Use Topics   Alcohol use: Yes    Comment: rarely   Drug use: No     Medication list has been reviewed and updated.  Current Meds  Medication Sig   atorvastatin (LIPITOR) 40 MG tablet TAKE 1 TABLET BY MOUTH ONCE DAILY   clobetasol cream (TEMOVATE) 0.05 % SMARTSIG:Sparingly Topical Twice Daily   empagliflozin (JARDIANCE) 25 MG TABS tablet Take 1 tablet (25 mg total) by mouth daily before breakfast.   folic acid (  FOLVITE) 1 MG tablet TAKE 2 TABLETS BY MOUTH ONCE DAILY WITH MEALS   lisinopril (ZESTRIL) 40 MG tablet TAKE 1 TABLET BY MOUTH ONCE DAILY   methotrexate 2.5 MG tablet TAKE 6 TABLETS BY MOUTH ONCE WEEKLY   sitaGLIPtin (JANUVIA) 100 MG tablet Take 1 tablet (100 mg total) by mouth daily.       02/19/2022    9:42 AM 10/15/2021    9:45 AM 06/17/2021    8:06 AM 02/08/2021    8:28 AM  GAD 7 : Generalized Anxiety Score  Nervous, Anxious, on Edge 0 1 0 0  Control/stop worrying 1 1 1  0  Worry too much - different things 1 1 1  0  Trouble relaxing 1 1 1  0  Restless 1 1 0 0  Easily annoyed or irritable 0 1 0 0   Afraid - awful might happen 0 0 0 0  Total GAD 7 Score 4 6 3  0  Anxiety Difficulty Not difficult at all Not difficult at all Not difficult at all        02/19/2022    9:42 AM 10/15/2021    9:45 AM 06/17/2021    8:06 AM  Depression screen PHQ 2/9  Decreased Interest 0 1 0  Down, Depressed, Hopeless 0 1 0  PHQ - 2 Score 0 2 0  Altered sleeping 2 2 0  Tired, decreased energy 2 0 0  Change in appetite 0 1 0  Feeling bad or failure about yourself  0 0 0  Trouble concentrating 0 1 0  Moving slowly or fidgety/restless 0 0 0  Suicidal thoughts 0 0 0  PHQ-9 Score 4 6 0  Difficult doing work/chores Not difficult at all Not difficult at all Not difficult at all    BP Readings from Last 3 Encounters:  02/19/22 118/72  10/15/21 112/82  06/17/21 116/70    Physical Exam Vitals and nursing note reviewed.  Constitutional:      General: He is not in acute distress.    Appearance: He is well-developed.  HENT:     Head: Normocephalic and atraumatic.  Cardiovascular:     Rate and Rhythm: Normal rate and regular rhythm.     Pulses: Normal pulses.     Heart sounds: No murmur heard. Pulmonary:     Effort: Pulmonary effort is normal. No respiratory distress.     Breath sounds: No wheezing or rhonchi.  Musculoskeletal:     Right lower leg: No edema.     Left lower leg: No edema.  Skin:    General: Skin is warm and dry.     Capillary Refill: Capillary refill takes less than 2 seconds.     Findings: No rash.  Neurological:     General: No focal deficit present.     Mental Status: He is alert and oriented to person, place, and time.  Psychiatric:        Mood and Affect: Mood normal.        Behavior: Behavior normal.     Wt Readings from Last 3 Encounters:  02/19/22 223 lb (101.2 kg)  10/15/21 233 lb (105.7 kg)  06/17/21 233 lb (105.7 kg)    BP 118/72 (BP Location: Right Arm, Cuff Size: Large)   Pulse 90   Ht 6' (1.829 m)   Wt 223 lb (101.2 kg)   SpO2 95%   BMI 30.24 kg/m    Assessment and Plan: 1. Type II diabetes mellitus with complication (HCC) BS should be improved with  weight loss and medication change Will switch to [facility] when his current Rx runs out - Comprehensive metabolic panel - Hemoglobin A1c - Empagliflozin-linaGLIPtin (GLYXAMBI) 25-5 MG TABS; Take 1 tablet by mouth daily at 6 (six) AM.  Dispense: 30 tablet; Refill: 0  2. Essential hypertension Clinically stable exam with well controlled BP. Tolerating medications without side effects at this time. Pt to continue current regimen and low sodium diet; benefits of regular exercise as able discussed.   Partially dictated using Animal nutritionist. Any errors are unintentional.  Bari Edward, MD [facility]  02/19/2022

## 2022-02-20 LAB — COMPREHENSIVE METABOLIC PANEL
ALT: 26 IU/L (ref 0–44)
AST: 19 IU/L (ref 0–40)
Albumin/Globulin Ratio: 1.8 (ref 1.2–2.2)
Albumin: 4.7 g/dL (ref 3.8–4.9)
Alkaline Phosphatase: 79 IU/L (ref 44–121)
BUN/Creatinine Ratio: 14 (ref 9–20)
BUN: 13 mg/dL (ref 6–24)
Bilirubin Total: 0.5 mg/dL (ref 0.0–1.2)
CO2: 21 mmol/L (ref 20–29)
Calcium: 10.6 mg/dL — ABNORMAL HIGH (ref 8.7–10.2)
Chloride: 102 mmol/L (ref 96–106)
Creatinine, Ser: 0.92 mg/dL (ref 0.76–1.27)
Globulin, Total: 2.6 g/dL (ref 1.5–4.5)
Glucose: 152 mg/dL — ABNORMAL HIGH (ref 70–99)
Potassium: 4.6 mmol/L (ref 3.5–5.2)
Sodium: 142 mmol/L (ref 134–144)
Total Protein: 7.3 g/dL (ref 6.0–8.5)
eGFR: 96 mL/min/{1.73_m2} (ref >59)

## 2022-02-20 LAB — HEMOGLOBIN A1C
Est. average glucose Bld gHb Est-mCnc: 180 mg/dL
Hgb A1c MFr Bld: 7.9 % — ABNORMAL HIGH (ref 4.8–5.6)

## 2022-03-12 ENCOUNTER — Other Ambulatory Visit: Payer: Self-pay | Admitting: Internal Medicine

## 2022-03-12 DIAGNOSIS — E1169 Type 2 diabetes mellitus with other specified complication: Secondary | ICD-10-CM

## 2022-03-13 NOTE — Telephone Encounter (Signed)
Requested Prescriptions  Pending Prescriptions Disp Refills  . atorvastatin (LIPITOR) 40 MG tablet [Pharmacy Med Name: atorvastatin 40 mg tablet] 90 tablet 1    Sig: TAKE 1 TABLET BY MOUTH ONCE DAILY     Cardiovascular:  Antilipid - Statins Failed - 03/12/2022  6:22 PM      Failed - Lipid Panel in normal range within the last 12 months    Cholesterol, Total  Date Value Ref Range Status  06/12/2020 171 100 - 199 mg/dL Final   Cholesterol  Date Value Ref Range Status  04/29/2021 169 0 - 200 Final   LDL Chol Calc (NIH)  Date Value Ref Range Status  06/12/2020 106 (H) 0 - 99 mg/dL Final   LDL Cholesterol  Date Value Ref Range Status  04/29/2021 105  Final   HDL  Date Value Ref Range Status  04/29/2021 34 (A) 35 - 70 Final  06/12/2020 37 (L) >39 mg/dL Final   Triglycerides  Date Value Ref Range Status  04/29/2021 152 40 - 160 Final         Passed - Patient is not pregnant      Passed - Valid encounter within last 12 months    Recent Outpatient Visits          3 weeks ago Type II diabetes mellitus with complication ([place])   [place] [facility] at [facility], Jesse Sans, MD   4 months ago Type II diabetes mellitus with complication [facility])   [place] [facility] at [facility], Jesse Sans, MD   8 months ago Annual physical exam   [place] [facility] at [facility], Jesse Sans, MD   1 year ago Type II diabetes mellitus with complication [facility])   [place] [facility] Medicine at [facility], Jesse Sans, MD   1 year ago Type II diabetes mellitus with complication [facility])   [place] [facility] at [facility], Jesse Sans, MD      Future Appointments            In 3 months Army Melia, Jesse Sans, MD [facility] and [facility] Medicine at [facility], [facility]

## 2022-04-08 ENCOUNTER — Other Ambulatory Visit: Payer: Self-pay | Admitting: Internal Medicine

## 2022-04-08 DIAGNOSIS — I10 Essential (primary) hypertension: Secondary | ICD-10-CM

## 2022-04-08 DIAGNOSIS — E118 Type 2 diabetes mellitus with unspecified complications: Secondary | ICD-10-CM

## 2022-04-08 NOTE — Telephone Encounter (Signed)
Requested Prescriptions  Pending Prescriptions Disp Refills  . lisinopril (ZESTRIL) 40 MG tablet [Pharmacy Med Name: lisinopril 40 mg tablet] 90 tablet 1    Sig: TAKE 1 TABLET BY MOUTH ONCE DAILY     Cardiovascular:  ACE Inhibitors Passed - 04/08/2022 10:14 AM      Passed - Cr in normal range and within 180 days    Creatinine, Ser  Date Value Ref Range Status  02/19/2022 0.92 0.76 - 1.27 mg/dL Final         Passed - K in normal range and within 180 days    Potassium  Date Value Ref Range Status  02/19/2022 4.6 3.5 - 5.2 mmol/L Final         Passed - Patient is not pregnant      Passed - Last BP in normal range    BP Readings from Last 1 Encounters:  02/19/22 118/72         Passed - Valid encounter within last 6 months    Recent Outpatient Visits          1 month ago Type II diabetes mellitus with complication ([place])   [place] [facility] at [facility], Jesse Sans, MD   5 months ago Type II diabetes mellitus with complication [facility])   [place] [facility] and [facility] Medicine at [facility], Jesse Sans, MD   9 months ago Annual physical exam   [place] [facility] at [facility], Jesse Sans, MD   1 year ago Type II diabetes mellitus with complication [facility])   [place] [facility] and [facility] Medicine at [facility], Jesse Sans, MD   1 year ago Type II diabetes mellitus with complication [facility])   [place] [facility] and [facility] Medicine at [facility], Jesse Sans, MD      Future Appointments            In 2 months Army Melia, Jesse Sans, MD [facility] Primary Care and Sports Medicine at [facility], [facility]           . JARDIANCE 25 MG TABS tablet [Pharmacy Med Name: Jardiance 25 mg tablet] 90 tablet 1    Sig: TAKE 1 TABLET BY MOUTH EVERY MORNING BEFORE BREAKFAST     Endocrinology:  Diabetes - SGLT2 Inhibitors Passed - 04/08/2022 10:14 AM      Passed - Cr in  normal range and within 360 days    Creatinine, Ser  Date Value Ref Range Status  02/19/2022 0.92 0.76 - 1.27 mg/dL Final         Passed - HBA1C is between 0 and 7.9 and within 180 days    Hemoglobin A1C  Date Value Ref Range Status  04/22/2017 6.3  Final   Hgb A1c MFr Bld  Date Value Ref Range Status  02/19/2022 7.9 (H) 4.8 - 5.6 % Final    Comment:             Prediabetes: 5.7 - 6.4          Diabetes: >6.4          Glycemic control for adults with diabetes: <7.0          Passed - eGFR in normal range and within 360 days    GFR calc Af Amer  Date Value Ref Range Status  06/12/2020 104 >59 mL/min/1.73 Final    Comment:    **In accordance with recommendations from  the NKF-ASN Task force,**   [facility] is in the process of updating its eGFR calculation to the   2021 CKD-EPI creatinine equation that estimates kidney function   without a race variable.    GFR calc non Af Amer  Date Value Ref Range Status  04/29/2021 87  Final   eGFR  Date Value Ref Range Status  02/19/2022 96 >59 mL/min/1.73 Final         Passed - Valid encounter within last 6 months    Recent Outpatient Visits          1 month ago Type II diabetes mellitus with complication ([place])   [place] [facility] at [facility], Jesse Sans, MD   5 months ago Type II diabetes mellitus with complication [facility])   [place] [facility] at [facility], Jesse Sans, MD   9 months ago Annual physical exam   [place] [facility] and [facility] Medicine at [facility], Jesse Sans, MD   1 year ago Type II diabetes mellitus with complication [facility])   [place] [facility] and [facility] Medicine at [facility], Jesse Sans, MD   1 year ago Type II diabetes mellitus with complication [facility])   [place] [facility] and Sports Medicine at [facility], Jesse Sans, MD      Future Appointments            In 2 months Army Melia,  Jesse Sans, MD [place] and Sports Medicine at [facility], [facility]

## 2022-04-23 ENCOUNTER — Other Ambulatory Visit: Payer: Self-pay | Admitting: Internal Medicine

## 2022-04-23 DIAGNOSIS — E118 Type 2 diabetes mellitus with unspecified complications: Secondary | ICD-10-CM

## 2022-04-23 MED ORDER — GLYXAMBI 25-5 MG PO TABS: 1.0000 | ORAL_TABLET | Freq: Every day | ORAL | 1 refills | Status: DC

## 2022-05-12 LAB — HEPATIC FUNCTION PANEL
ALT: 27 U/L (ref 10–40)
AST: 20 (ref 14–40)
Alkaline Phosphatase: 62 (ref 25–125)
Bilirubin, Total: 0.7

## 2022-05-12 LAB — BASIC METABOLIC PANEL
BUN: 13 (ref 4–21)
CO2: 21 (ref 13–22)
Chloride: 102 (ref 99–108)
Creatinine: 0.8 (ref 0.6–1.3)
Glucose: 188
Potassium: 4.1 mEq/L (ref 3.5–5.1)
Sodium: 141 (ref 137–147)

## 2022-05-12 LAB — COMPREHENSIVE METABOLIC PANEL: eGFR: 102

## 2022-06-25 ENCOUNTER — Encounter: Payer: Managed Care, Other (non HMO) | Admitting: Internal Medicine

## 2022-07-08 ENCOUNTER — Ambulatory Visit (INDEPENDENT_AMBULATORY_CARE_PROVIDER_SITE_OTHER): Payer: Managed Care, Other (non HMO) | Admitting: Internal Medicine

## 2022-07-08 ENCOUNTER — Encounter: Payer: Self-pay | Admitting: Internal Medicine

## 2022-07-08 VITALS — BP 125/78 | HR 99 | Ht 72.0 in | Wt 222.0 lb

## 2022-07-08 DIAGNOSIS — Z0000 Encounter for general adult medical examination without abnormal findings: Secondary | ICD-10-CM

## 2022-07-08 DIAGNOSIS — I10 Essential (primary) hypertension: Secondary | ICD-10-CM | POA: Diagnosis not present

## 2022-07-08 DIAGNOSIS — L4050 Arthropathic psoriasis, unspecified: Secondary | ICD-10-CM | POA: Diagnosis not present

## 2022-07-08 DIAGNOSIS — Z125 Encounter for screening for malignant neoplasm of prostate: Secondary | ICD-10-CM

## 2022-07-08 DIAGNOSIS — E118 Type 2 diabetes mellitus with unspecified complications: Secondary | ICD-10-CM

## 2022-07-08 DIAGNOSIS — E1169 Type 2 diabetes mellitus with other specified complication: Secondary | ICD-10-CM | POA: Diagnosis not present

## 2022-07-08 DIAGNOSIS — E785 Hyperlipidemia, unspecified: Secondary | ICD-10-CM | POA: Diagnosis not present

## 2022-07-08 MED ORDER — BLOOD GLUCOSE MONITORING SUPPL DEVI: 1.0000 | Freq: Three times a day (TID) | 0 refills | Status: AC

## 2022-07-08 MED ORDER — LANCET DEVICE MISC: 1.0000 | Freq: Three times a day (TID) | 0 refills | Status: AC

## 2022-07-08 MED ORDER — LANCETS MISC. MISC: 1.0000 | Freq: Three times a day (TID) | 0 refills | Status: AC

## 2022-07-08 MED ORDER — BLOOD GLUCOSE TEST VI STRP: ORAL_STRIP | 0 refills | Status: AC

## 2022-07-08 NOTE — Progress Notes (Signed)
Date:  07/08/2022   Name:  Russell Wright   DOB:  [birth year]   MRN:  [id number]   Chief Complaint: Annual Exam Russell Wright is a 62 y.o. male who presents today for his Complete Annual Exam. He feels well. He reports exercising walking daily 1-1.5 miles. He reports he is sleeping poorly.   Colonoscopy: 2017 repeat 10 yrs  Immunization History  Administered Date(s) Administered   Influenza,inj,Quad PF,6+ Mos 06/22/2018, 03/28/2019, 04/05/2020, 02/08/2021, 02/19/2022   Influenza,inj,quad, With Preservative 06/10/2015   Influenza-Unspecified 04/09/2017   Moderna Sars-Covid-2 Vaccination 09/02/2019, 09/29/2019, 04/20/2020, 09/12/2020, 05/17/2021   Pneumococcal Polysaccharide-23 03/28/2019   Tdap 04/16/2015   Health Maintenance Due  Topic Date Due   Zoster Vaccines- Shingrix (1 of 2) Never done   Diabetic kidney evaluation - Urine ACR  06/17/2022    Lab Results  Component Value Date   PSA1 0.8 06/17/2021   PSA1 0.8 06/12/2020   PSA1 0.8 03/28/2019   PSA 0.6 04/22/2017     Hypertension This is a chronic problem. The problem is controlled. Pertinent negatives include no chest pain, headaches, palpitations or shortness of breath.  Diabetes He presents for his follow-up diabetic visit. He has type 2 diabetes mellitus. Pertinent negatives for hypoglycemia include no dizziness, headaches or nervousness/anxiousness. Pertinent negatives for diabetes include no chest pain and no fatigue. Current diabetic treatment includes oral agent (dual therapy). An ACE inhibitor/angiotensin II receptor blocker is being taken. Eye exam is current.  Hyperlipidemia This is a chronic problem. The problem is uncontrolled. Recent lipid tests were reviewed and are high. Pertinent negatives include no chest pain, myalgias or shortness of breath.    Lab Results  Component Value Date   NA 141 05/12/2022   K 4.1 05/12/2022   CO2 21 05/12/2022   GLUCOSE 152 (H) 02/19/2022   BUN 13 05/12/2022   CREATININE  0.8 05/12/2022   CALCIUM 10.6 (H) 02/19/2022   EGFR 102 05/12/2022   GFRNONAA 87 04/29/2021   Lab Results  Component Value Date   CHOL 169 04/29/2021   HDL 34 (A) 04/29/2021   LDLCALC 105 04/29/2021   TRIG 152 04/29/2021   CHOLHDL 4.6 06/12/2020   No results found for: "TSH" Lab Results  Component Value Date   HGBA1C 7.9 (H) 02/19/2022   Lab Results  Component Value Date   WBC 10.1 04/29/2021   HGB 15.8 04/29/2021   HCT 47 04/29/2021   MCV 89 06/12/2020   PLT 278 04/29/2021   Lab Results  Component Value Date   ALT 27 05/12/2022   AST 20 05/12/2022   ALKPHOS 62 05/12/2022   BILITOT 0.5 02/19/2022   No results found for: "25OHVITD2", "25OHVITD3", "VD25OH"   Review of Systems  Constitutional:  Negative for appetite change, chills, diaphoresis, fatigue and unexpected weight change.  HENT:  Negative for hearing loss, tinnitus, trouble swallowing and voice change.   Eyes:  Negative for visual disturbance.  Respiratory:  Negative for choking, shortness of breath and wheezing.   Cardiovascular:  Negative for chest pain, palpitations and leg swelling.  Gastrointestinal:  Negative for abdominal pain, blood in stool, constipation and diarrhea.  Genitourinary:  Negative for difficulty urinating, dysuria and frequency.  Musculoskeletal:  Negative for arthralgias, back pain and myalgias.  Skin:  Negative for color change and rash.  Neurological:  Negative for dizziness, syncope and headaches.  Hematological:  Negative for adenopathy.  Psychiatric/Behavioral:  Negative for dysphoric mood and sleep disturbance. The patient is not nervous/anxious.  Patient Active Problem List   Diagnosis Date Noted   Sleep disturbance 09/15/2017   Psoriatic arthritis ([place]) 06/17/2017   Essential hypertension 06/17/2017   Hyperlipidemia associated with type 2 diabetes mellitus ([place]) 06/17/2017   Type II diabetes mellitus with complication ([place]) 63/14/9702   Hair loss    Psoriasis,  unspecified 09/19/2016    Allergies  Allergen Reactions   Metformin And Related Diarrhea and Other (See Comments)    Diarrhea causing dehydration    Past Surgical History:  Procedure Laterality Date   COLONOSCOPY  05/09/2017   cleared for 10 years   TYMPANOPLASTY     repair- 62 years old    Social History   Tobacco Use   Smoking status: Never   Smokeless tobacco: Never  Vaping Use   Vaping Use: Never used  Substance Use Topics   Alcohol use: Yes    Comment: rarely   Drug use: No     Medication list has been reviewed and updated.  Current Meds  Medication Sig   atorvastatin (LIPITOR) 40 MG tablet TAKE 1 TABLET BY MOUTH ONCE DAILY   Blood Glucose Monitoring Suppl DEVI 1 each by Does not apply route in the morning, at noon, and at bedtime. May substitute to any manufacturer covered by patient's insurance.   clobetasol cream (TEMOVATE) 0.05 % SMARTSIG:Sparingly Topical Twice Daily   Empagliflozin-linaGLIPtin (GLYXAMBI) 25-5 MG TABS Take 1 tablet by mouth daily at 6 (six) AM.   folic acid (FOLVITE) 1 MG tablet TAKE 2 TABLETS BY MOUTH ONCE DAILY WITH MEALS   Glucose Blood (BLOOD GLUCOSE TEST STRIPS) STRP May substitute to any manufacturer covered by patient's insurance. Test BS twice a day.   Lancet Device MISC 1 each by Does not apply route in the morning, at noon, and at bedtime. May substitute to any manufacturer covered by patient's insurance.   Lancets Misc. MISC 1 each by Does not apply route in the morning, at noon, and at bedtime. May substitute to any manufacturer covered by patient's insurance.   lisinopril (ZESTRIL) 40 MG tablet TAKE 1 TABLET BY MOUTH ONCE DAILY   methotrexate 2.5 MG tablet TAKE 6 TABLETS BY MOUTH ONCE WEEKLY       07/08/2022    9:13 AM 02/19/2022    9:42 AM 10/15/2021    9:45 AM 06/17/2021    8:06 AM  GAD 7 : Generalized Anxiety Score  Nervous, Anxious, on Edge 2 0 1 0  Control/stop worrying 3 1 1 1   Worry too much - different things 3 1 1 1    Trouble relaxing 1 1 1 1   Restless 2 1 1  0  Easily annoyed or irritable 2 0 1 0  Afraid - awful might happen 3 0 0 0  Total GAD 7 Score 16 4 6 3   Anxiety Difficulty Not difficult at all Not difficult at all Not difficult at all Not difficult at all       07/08/2022    9:13 AM 02/19/2022    9:42 AM 10/15/2021    9:45 AM  Depression screen PHQ 2/9  Decreased Interest 0 0 1  Down, Depressed, Hopeless 1 0 1  PHQ - 2 Score 1 0 2  Altered sleeping 2 2 2   Tired, decreased energy 2 2 0  Change in appetite 0 0 1  Feeling bad or failure about yourself  0 0 0  Trouble concentrating 0 0 1  Moving slowly or fidgety/restless 0 0 0  Suicidal thoughts 0 0 0  PHQ-9 Score 5 4 6   Difficult doing work/chores Not difficult at all Not difficult at all Not difficult at all    BP Readings from Last 3 Encounters:  07/08/22 125/78  02/19/22 118/72  10/15/21 112/82    Physical Exam Vitals and nursing note reviewed.  Constitutional:      Appearance: Normal appearance. He is well-developed.  HENT:     Head: Normocephalic.     Right Ear: Tympanic membrane, ear canal and external ear normal.     Left Ear: Tympanic membrane, ear canal and external ear normal.     Nose: Nose normal.  Eyes:     Conjunctiva/sclera: Conjunctivae normal.     Pupils: Pupils are equal, round, and reactive to light.  Neck:     Thyroid: No thyromegaly.     Vascular: No carotid bruit.  Cardiovascular:     Rate and Rhythm: Normal rate and regular rhythm.     Heart sounds: Normal heart sounds.  Pulmonary:     Effort: Pulmonary effort is normal.     Breath sounds: Normal breath sounds. No wheezing.  Chest:  Breasts:    Right: No mass.     Left: No mass.  Abdominal:     General: Bowel sounds are normal.     Palpations: Abdomen is soft.     Tenderness: There is no abdominal tenderness.  Musculoskeletal:        General: Normal range of motion.     Cervical back: Normal range of motion and neck supple.     Right knee:  No swelling or deformity.     Left knee: Effusion present. No swelling or deformity.     Right lower leg: No edema.     Left lower leg: No edema.  Lymphadenopathy:     Cervical: No cervical adenopathy.  Skin:    General: Skin is warm and dry.  Neurological:     General: No focal deficit present.     Mental Status: He is alert and oriented to person, place, and time.     Deep Tendon Reflexes: Reflexes are normal and symmetric.  Psychiatric:        Attention and Perception: Attention normal.        Mood and Affect: Mood normal.        Thought Content: Thought content normal.     Wt Readings from Last 3 Encounters:  07/08/22 222 lb (100.7 kg)  02/19/22 223 lb (101.2 kg)  10/15/21 233 lb (105.7 kg)    BP 125/78 (BP Location: Right Arm, Cuff Size: Normal)   Pulse 99   Ht 6' (1.829 m)   Wt 222 lb (100.7 kg)   SpO2 96%   BMI 30.11 kg/m   Assessment and Plan: Problem List Items Addressed This Visit       Cardiovascular and Mediastinum   Essential hypertension (Chronic)    Clinically stable exam with well controlled BP on lisinopril. Tolerating medications without side effects. Pt to continue current regimen and low sodium diet.       Relevant Orders   CBC with Differential/Platelet     Endocrine   Hyperlipidemia associated with type 2 diabetes mellitus (HCC) (Chronic)    Tolerating statin medication without side effects at this time LDL is  Lab Results  Component Value Date   LDLCALC 105 04/29/2021  with a goal of < 70. Current dose will be adjusted if needed.       Relevant Orders   Lipid panel  Type II diabetes mellitus with complication (HCC) (Chronic)    Clinically stable without s/s of hypoglycemia. Medication changed to [place] last visit as Glixambi  25/5 Last A1C 7.9        Relevant Medications   Blood Glucose Monitoring Suppl DEVI   Glucose Blood (BLOOD GLUCOSE TEST STRIPS) STRP   Lancet Device MISC   Lancets Misc. MISC    Other Relevant Orders   Hemoglobin A1c   Microalbumin / creatinine urine ratio     Musculoskeletal and Integument   Psoriatic arthritis ([place]) (Chronic)    On MTX with good control of symptoms CMP in December was normal.      Other Visit Diagnoses     Annual physical exam    -  Primary   Relevant Orders   CBC with Differential/Platelet   Hemoglobin A1c   Lipid panel   PSA   Prostate cancer screening       Relevant Orders   PSA        Partially dictated using Dragon software. Any errors are unintentional.  Halina Maidens, MD [facility]  07/08/2022

## 2022-07-08 NOTE — Assessment & Plan Note (Signed)
Clinically stable exam with well controlled BP on lisinopril. Tolerating medications without side effects. Pt to continue current regimen and low sodium diet.  

## 2022-07-08 NOTE — Assessment & Plan Note (Addendum)
Clinically stable without s/s of hypoglycemia. Medication changed to [place] last visit as Glixambi  25/5 Last A1C 7.9 Glucometer and supplies sent to pharmacy

## 2022-07-08 NOTE — Patient Instructions (Signed)
For BP - [facility] or [facility]

## 2022-07-08 NOTE — Assessment & Plan Note (Signed)
Tolerating statin medication without side effects at this time LDL is  Lab Results  Component Value Date   LDLCALC 105 04/29/2021   with a goal of < 70. Current dose will be adjusted if needed.

## 2022-07-08 NOTE — Assessment & Plan Note (Signed)
On MTX with good control of symptoms CMP in December was normal.

## 2022-07-10 LAB — PSA: Prostate Specific Ag, Serum: 0.7 ng/mL (ref 0.0–4.0)

## 2022-07-10 LAB — HEMOGLOBIN A1C
Est. average glucose Bld gHb Est-mCnc: 169 mg/dL
Hgb A1c MFr Bld: 7.5 % — ABNORMAL HIGH (ref 4.8–5.6)

## 2022-07-10 LAB — CBC WITH DIFFERENTIAL/PLATELET
Basophils Absolute: 0.1 10*3/uL (ref 0.0–0.2)
Basos: 1 %
EOS (ABSOLUTE): 0.1 10*3/uL (ref 0.0–0.4)
Eos: 1 %
Hematocrit: 50.6 % (ref 37.5–51.0)
Hemoglobin: 17.1 g/dL (ref 13.0–17.7)
Immature Grans (Abs): 0.1 10*3/uL (ref 0.0–0.1)
Immature Granulocytes: 1 %
Lymphocytes Absolute: 1.9 10*3/uL (ref 0.7–3.1)
Lymphs: 16 %
MCH: 29.8 pg (ref 26.6–33.0)
MCHC: 33.8 g/dL (ref 31.5–35.7)
MCV: 88 fL (ref 79–97)
Monocytes Absolute: 0.9 10*3/uL (ref 0.1–0.9)
Monocytes: 7 %
Neutrophils Absolute: 9.1 10*3/uL — ABNORMAL HIGH (ref 1.4–7.0)
Neutrophils: 74 %
Platelets: 327 10*3/uL (ref 150–450)
RBC: 5.73 x10E6/uL (ref 4.14–5.80)
RDW: 13 % (ref 11.6–15.4)
WBC: 12.1 10*3/uL — ABNORMAL HIGH (ref 3.4–10.8)

## 2022-07-10 LAB — LIPID PANEL
Chol/HDL Ratio: 4.2 ratio (ref 0.0–5.0)
Cholesterol, Total: 155 mg/dL (ref 100–199)
HDL: 37 mg/dL — ABNORMAL LOW (ref >39)
LDL Chol Calc (NIH): 76 mg/dL (ref 0–99)
Triglycerides: 253 mg/dL — ABNORMAL HIGH (ref 0–149)
VLDL Cholesterol Cal: 42 mg/dL — ABNORMAL HIGH (ref 5–40)

## 2022-07-10 LAB — MICROALBUMIN / CREATININE URINE RATIO
Creatinine, Urine: 78.8 mg/dL
Microalb/Creat Ratio: 80 mg/g creat — ABNORMAL HIGH (ref 0–29)
Microalbumin, Urine: 63.1 ug/mL

## 2022-08-30 ENCOUNTER — Other Ambulatory Visit: Payer: Self-pay | Admitting: Internal Medicine

## 2022-08-30 DIAGNOSIS — E785 Hyperlipidemia, unspecified: Secondary | ICD-10-CM

## 2022-09-27 ENCOUNTER — Other Ambulatory Visit: Payer: Self-pay | Admitting: Internal Medicine

## 2022-09-27 DIAGNOSIS — I10 Essential (primary) hypertension: Secondary | ICD-10-CM

## 2022-09-27 DIAGNOSIS — E118 Type 2 diabetes mellitus with unspecified complications: Secondary | ICD-10-CM

## 2022-11-12 ENCOUNTER — Ambulatory Visit: Payer: Managed Care, Other (non HMO) | Admitting: Internal Medicine

## 2022-12-05 ENCOUNTER — Ambulatory Visit: Payer: Managed Care, Other (non HMO) | Admitting: Internal Medicine

## 2022-12-15 ENCOUNTER — Encounter: Payer: Self-pay | Admitting: Internal Medicine

## 2022-12-15 ENCOUNTER — Ambulatory Visit: Payer: Managed Care, Other (non HMO) | Admitting: Internal Medicine

## 2022-12-15 VITALS — BP 116/68 | HR 84 | Ht 72.0 in | Wt 219.0 lb

## 2022-12-15 DIAGNOSIS — I10 Essential (primary) hypertension: Secondary | ICD-10-CM | POA: Diagnosis not present

## 2022-12-15 DIAGNOSIS — F5101 Primary insomnia: Secondary | ICD-10-CM | POA: Diagnosis not present

## 2022-12-15 DIAGNOSIS — E118 Type 2 diabetes mellitus with unspecified complications: Secondary | ICD-10-CM

## 2022-12-15 DIAGNOSIS — Z7984 Long term (current) use of oral hypoglycemic drugs: Secondary | ICD-10-CM | POA: Diagnosis not present

## 2022-12-15 NOTE — Assessment & Plan Note (Signed)
Sample of Belsomra 10 mg given to take just before bed

## 2022-12-15 NOTE — Assessment & Plan Note (Signed)
Stable exam with well controlled BP.  Currently taking lisinopril. Tolerating medications without concerns or side effects. Will continue to recommend low sodium diet and current regimen.  

## 2022-12-15 NOTE — Progress Notes (Signed)
Date:  12/15/2022   Name:  Russell Wright   DOB:  1961-01-04   MRN:  [id number]   Chief Complaint: Diabetes and Hypertension  Diabetes He presents for his follow-up diabetic visit. He has type 2 diabetes mellitus. Pertinent negatives for hypoglycemia include no headaches or tremors. Pertinent negatives for diabetes include no chest pain, no fatigue, no polydipsia and no polyuria. Current diabetic treatment includes oral agent (dual therapy) (glixambi). He is compliant with treatment all of the time. There is no change in his home blood glucose trend. An ACE inhibitor/angiotensin II receptor blocker is being taken. Eye exam is not current.  Hypertension This is a chronic problem. The problem is controlled. Pertinent negatives include no chest pain, headaches, palpitations or shortness of breath. Past treatments include ACE inhibitors.  Insomnia Primary symptoms: difficulty falling asleep, frequent awakening.   The problem occurs nightly. The problem is unchanged.    Lab Results  Component Value Date   NA 141 05/12/2022   K 4.1 05/12/2022   CO2 21 05/12/2022   GLUCOSE 152 (H) 02/19/2022   BUN 13 05/12/2022   CREATININE 0.8 05/12/2022   CALCIUM 10.6 (H) 02/19/2022   EGFR 102 05/12/2022   GFRNONAA 87 04/29/2021   Lab Results  Component Value Date   CHOL 155 07/08/2022   HDL 37 (L) 07/08/2022   LDLCALC 76 07/08/2022   TRIG 253 (H) 07/08/2022   CHOLHDL 4.2 07/08/2022   No results found for: "TSH" Lab Results  Component Value Date   HGBA1C 7.5 (H) 07/08/2022   Lab Results  Component Value Date   WBC 12.1 (H) 07/08/2022   HGB 17.1 07/08/2022   HCT 50.6 07/08/2022   MCV 88 07/08/2022   PLT 327 07/08/2022   Lab Results  Component Value Date   ALT 27 05/12/2022   AST 20 05/12/2022   ALKPHOS 62 05/12/2022   BILITOT 0.5 02/19/2022   No results found for: "25OHVITD2", "25OHVITD3", "VD25OH"   Review of Systems  Constitutional:  Negative for appetite change, fatigue and  unexpected weight change.  Eyes:  Negative for visual disturbance.  Respiratory:  Negative for cough, shortness of breath and wheezing.   Cardiovascular:  Negative for chest pain, palpitations and leg swelling.  Gastrointestinal:  Negative for abdominal pain and blood in stool.  Endocrine: Negative for polydipsia and polyuria.  Genitourinary:  Negative for dysuria and hematuria.  Skin:  Negative for color change and rash.  Neurological:  Negative for tremors, numbness and headaches.  Psychiatric/Behavioral:  Negative for dysphoric mood. The patient has insomnia.     Patient Active Problem List   Diagnosis Date Noted   Primary insomnia 12/15/2022   Sleep disturbance 09/15/2017   Psoriatic arthritis (HCC) 06/17/2017   Essential hypertension 06/17/2017   Hyperlipidemia associated with type 2 diabetes mellitus (HCC) 06/17/2017   Type II diabetes mellitus with complication (HCC) 06/17/2017   Hair loss    Psoriasis, unspecified 09/19/2016    Allergies  Allergen Reactions   Metformin And Related Diarrhea and Other (See Comments)    Diarrhea causing dehydration    Past Surgical History:  Procedure Laterality Date   COLONOSCOPY  05/09/2017   cleared for 10 years   TYMPANOPLASTY     repair- 62 years old    Social History   Tobacco Use   Smoking status: Never   Smokeless tobacco: Never  Vaping Use   Vaping Use: Never used  Substance Use Topics   Alcohol use: Yes  Comment: rarely   Drug use: No     Medication list has been reviewed and updated.  Current Meds  Medication Sig   Accu-Chek Softclix Lancets lancets 1 each by Other route 3 (three) times daily.   atorvastatin (LIPITOR) 40 MG tablet TAKE ONE TABLET BY MOUTH ONCE DAILY   Blood Glucose Monitoring Suppl (ACCU-CHEK GUIDE) w/Device KIT See admin instructions.   Blood Glucose Monitoring Suppl DEVI 1 each by Does not apply route in the morning, at noon, and at bedtime. May substitute to any manufacturer covered by  patient's insurance.   clobetasol cream (TEMOVATE) 0.05 % SMARTSIG:Sparingly Topical Twice Daily   folic acid (FOLVITE) 1 MG tablet TAKE 2 TABLETS BY MOUTH ONCE DAILY WITH MEALS   Glucose Blood (BLOOD GLUCOSE TEST STRIPS) STRP May substitute to any manufacturer covered by patient's insurance. Test BS twice a day.   GLYXAMBI 25-5 MG TABS take 1 tablet by mouth every morning AT 6am   lisinopril (ZESTRIL) 40 MG tablet TAKE ONE TABLET BY MOUTH ONCE DAILY   methotrexate 2.5 MG tablet TAKE 6 TABLETS BY MOUTH ONCE WEEKLY   tiZANidine (ZANAFLEX) 4 MG tablet TAKE ONE TABLET BY MOUTH EVERY 6 HOURS AS NEEDED FOR MUSCLE SPASMS   [DISCONTINUED] meloxicam (MOBIC) 7.5 MG tablet Take 1 tablet by mouth daily.       12/15/2022    3:19 PM 07/08/2022    9:13 AM 02/19/2022    9:42 AM 10/15/2021    9:45 AM  GAD 7 : Generalized Anxiety Score  Nervous, Anxious, on Edge 2 2 0 1  Control/stop worrying 3 3 1 1   Worry too much - different things 3 3 1 1   Trouble relaxing 2 1 1 1   Restless 3 2 1 1   Easily annoyed or irritable 2 2 0 1  Afraid - awful might happen 2 3 0 0  Total GAD 7 Score 17 16 4 6   Anxiety Difficulty Somewhat difficult Not difficult at all Not difficult at all Not difficult at all       12/15/2022    3:19 PM 07/08/2022    9:13 AM 02/19/2022    9:42 AM  Depression screen PHQ 2/9  Decreased Interest 0 0 0  Down, Depressed, Hopeless 0 1 0  PHQ - 2 Score 0 1 0  Altered sleeping 2 2 2   Tired, decreased energy 2 2 2   Change in appetite 0 0 0  Feeling bad or failure about yourself  0 0 0  Trouble concentrating 0 0 0  Moving slowly or fidgety/restless 0 0 0  Suicidal thoughts 0 0 0  PHQ-9 Score 4 5 4   Difficult doing work/chores Not difficult at all Not difficult at all Not difficult at all    BP Readings from Last 3 Encounters:  12/15/22 116/68  07/08/22 125/78  02/19/22 118/72    Physical Exam Vitals and nursing note reviewed.  Constitutional:      General: He is not in acute  distress.    Appearance: Normal appearance. He is well-developed.  HENT:     Head: Normocephalic and atraumatic.  Cardiovascular:     Rate and Rhythm: Normal rate and regular rhythm.     Heart sounds: No murmur heard. Pulmonary:     Effort: Pulmonary effort is normal. No respiratory distress.     Breath sounds: No wheezing or rhonchi.  Musculoskeletal:     Cervical back: Normal range of motion.     Right lower leg: No edema.  Left lower leg: No edema.  Lymphadenopathy:     Cervical: No cervical adenopathy.  Skin:    General: Skin is warm and dry.     Capillary Refill: Capillary refill takes less than 2 seconds.     Findings: No rash.  Neurological:     General: No focal deficit present.     Mental Status: He is alert and oriented to person, place, and time.  Psychiatric:        Mood and Affect: Mood normal.        Behavior: Behavior normal.     Wt Readings from Last 3 Encounters:  12/15/22 219 lb (99.3 kg)  07/08/22 222 lb (100.7 kg)  02/19/22 223 lb (101.2 kg)    BP 116/68   Pulse 84   Ht 6' (1.829 m)   Wt 219 lb (99.3 kg)   SpO2 95%   BMI 29.70 kg/m   Assessment and Plan:  Problem List Items Addressed This Visit     Type II diabetes mellitus with complication (HCC) - Primary (Chronic)    Blood sugars stable without hypoglycemic symptoms or events. Currently being treated with Jardiance and Tradjenta (Glyxambi 25/5). Mild microalbuminuria noted 6 mo ago. Lab Results  Component Value Date   HGBA1C 7.5 (H) 07/08/2022        Relevant Orders   Basic metabolic panel   Hemoglobin A1c   Microalbumin / creatinine urine ratio   Primary insomnia    Sample of Belsomra 10 mg given to take just before bed      Essential hypertension (Chronic)    Stable exam with well controlled BP.  Currently taking lisinopril. Tolerating medications without concerns or side effects. Will continue to recommend low sodium diet and current regimen.       Other Visit  Diagnoses     Long term current use of oral hypoglycemic drug           No follow-ups on file.   Partially dictated using Dragon software, any errors are not intentional.  Reubin Milan, MD [facility] Primary Care and Sports Medicine [place], [place]

## 2022-12-15 NOTE — Assessment & Plan Note (Addendum)
Blood sugars stable without hypoglycemic symptoms or events. Currently being treated with Jardiance and Tradjenta (Glyxambi 25/5). Mild microalbuminuria noted 6 mo ago. Lab Results  Component Value Date   HGBA1C 7.5 (H) 07/08/2022

## 2022-12-16 LAB — BASIC METABOLIC PANEL
BUN/Creatinine Ratio: 22 (ref 10–24)
BUN: 17 mg/dL (ref 8–27)
CO2: 23 mmol/L (ref 20–29)
Calcium: 10.2 mg/dL (ref 8.6–10.2)
Chloride: 104 mmol/L (ref 96–106)
Creatinine, Ser: 0.78 mg/dL (ref 0.76–1.27)
Glucose: 103 mg/dL — ABNORMAL HIGH (ref 70–99)
Potassium: 4.8 mmol/L (ref 3.5–5.2)
Sodium: 142 mmol/L (ref 134–144)
eGFR: 102 mL/min/{1.73_m2} (ref >59)

## 2022-12-16 LAB — MICROALBUMIN / CREATININE URINE RATIO
Creatinine, Urine: 150.3 mg/dL
Microalb/Creat Ratio: 33 mg/g creat — ABNORMAL HIGH (ref 0–29)
Microalbumin, Urine: 49.8 ug/mL

## 2022-12-16 LAB — HEMOGLOBIN A1C
Est. average glucose Bld gHb Est-mCnc: 174 mg/dL
Hgb A1c MFr Bld: 7.7 % — ABNORMAL HIGH (ref 4.8–5.6)

## 2022-12-17 ENCOUNTER — Telehealth: Payer: Self-pay | Admitting: *Deleted

## 2022-12-17 NOTE — Telephone Encounter (Signed)
Patient called and notified:    A1C is slightly higher, renal function is normal and urine protein is improved.  Continue current medications.   Patient understands and will continue

## 2023-03-16 ENCOUNTER — Other Ambulatory Visit: Payer: Self-pay | Admitting: Internal Medicine

## 2023-03-16 DIAGNOSIS — E785 Hyperlipidemia, unspecified: Secondary | ICD-10-CM

## 2023-04-23 ENCOUNTER — Other Ambulatory Visit: Payer: Self-pay | Admitting: Internal Medicine

## 2023-04-23 DIAGNOSIS — E118 Type 2 diabetes mellitus with unspecified complications: Secondary | ICD-10-CM

## 2023-04-23 NOTE — Telephone Encounter (Signed)
Requested Prescriptions  Pending Prescriptions Disp Refills   Empagliflozin-linaGLIPtin (GLYXAMBI) 25-5 MG TABS [Pharmacy Med Name: Glyxambi 25 mg-5 mg tablet] 90 tablet 0    Sig: take 1 tablet by mouth every morning AT 6AM     Endocrinology: Diabetes - DPP-4 Inhibitor + SGLT2 Inhibitor Combos Passed - 04/23/2023  1:07 PM      Passed - HBA1C is between 0 and 7.9 and within 180 days    Hemoglobin A1C  Date Value Ref Range Status  04/22/2017 6.3  Final   Hgb A1c MFr Bld  Date Value Ref Range Status  12/15/2022 7.7 (H) 4.8 - 5.6 % Final    Comment:             Prediabetes: 5.7 - 6.4          Diabetes: >6.4          Glycemic control for adults with diabetes: <7.0          Passed - Cr in normal range and within 360 days    Creatinine, Ser  Date Value Ref Range Status  12/15/2022 0.78 0.76 - 1.27 mg/dL Final         Passed - eGFR in normal range and within 360 days    GFR calc Af Amer  Date Value Ref Range Status  06/12/2020 104 >59 mL/min/1.73 Final    Comment:    **In accordance with recommendations from the NKF-ASN Task force,**   [facility] is in the process of updating its eGFR calculation to the   2021 CKD-EPI creatinine equation that estimates kidney function   without a race variable.    GFR calc non Af Amer  Date Value Ref Range Status  04/29/2021 87  Final   eGFR  Date Value Ref Range Status  12/15/2022 102 >59 mL/min/1.73 Final         Passed - Valid encounter within last 6 months    Recent Outpatient Visits           4 months ago Type II diabetes mellitus with complication [facility])   [place] [facility] at [facility], Nyoka Cowden, MD   9 months ago Annual physical exam   [facility] at [facility], Nyoka Cowden, MD   1 year ago Type II diabetes mellitus with complication [facility])   [place] [facility] Medicine at [facility], Nyoka Cowden, MD   1 year ago Type II  diabetes mellitus with complication [facility])    Primary Care & Sports Medicine at [facility], Nyoka Cowden, MD   1 year ago Annual physical exam   [facility] at [facility], Nyoka Cowden, MD       Future Appointments             In 2 months Judithann Graves, Nyoka Cowden, MD [facility] Primary [facility] & Sports Medicine at [facility], [facility]

## 2023-04-24 ENCOUNTER — Other Ambulatory Visit: Payer: Self-pay | Admitting: Internal Medicine

## 2023-04-24 DIAGNOSIS — E118 Type 2 diabetes mellitus with unspecified complications: Secondary | ICD-10-CM

## 2023-04-24 NOTE — Telephone Encounter (Signed)
Medication Refill -  Most Recent Primary Care Visit:  Provider: Reubin Milan  Department: [facility]  Visit Type: OFFICE VISIT  Date: 12/15/2022  Medication: Empagliflozin-linaGLIPtin (GLYXAMBI) 25-5 MG TABS   Has the patient contacted their pharmacy? Yes No refills  Is this the correct pharmacy for this prescription? Yes  This is the patient's preferred pharmacy: [facility] - [place], [place] - [address] [place] [address] [place] [address] Phone: [phone] Fax: [phone]  Has the prescription been filled recently? Yes  Is the patient out of the medication? Yes  Has the patient been seen for an appointment in the last year OR does the patient have an upcoming appointment? Yes  Can we respond through MyChart? Yes  Agent: Please be advised that Rx refills may take up to 3 business days. We ask that you follow-up with your pharmacy.

## 2023-04-24 NOTE — Telephone Encounter (Signed)
Duplicate request, last refilled 04/23/23.  Requested Prescriptions  Pending Prescriptions Disp Refills   Empagliflozin-linaGLIPtin (GLYXAMBI) 25-5 MG TABS 90 tablet 0     Endocrinology: Diabetes - DPP-4 Inhibitor + SGLT2 Inhibitor Combos Passed - 04/24/2023 11:09 AM      Passed - HBA1C is between 0 and 7.9 and within 180 days    Hemoglobin A1C  Date Value Ref Range Status  04/22/2017 6.3  Final   Hgb A1c MFr Bld  Date Value Ref Range Status  12/15/2022 7.7 (H) 4.8 - 5.6 % Final    Comment:             Prediabetes: 5.7 - 6.4          Diabetes: >6.4          Glycemic control for adults with diabetes: <7.0          Passed - Cr in normal range and within 360 days    Creatinine, Ser  Date Value Ref Range Status  12/15/2022 0.78 0.76 - 1.27 mg/dL Final         Passed - eGFR in normal range and within 360 days    GFR calc Af Amer  Date Value Ref Range Status  06/12/2020 104 >59 mL/min/1.73 Final    Comment:    **In accordance with recommendations from the NKF-ASN Task force,**   [facility] is in the process of updating its eGFR calculation to the   2021 CKD-EPI creatinine equation that estimates kidney function   without a race variable.    GFR calc non Af Amer  Date Value Ref Range Status  04/29/2021 87  Final   eGFR  Date Value Ref Range Status  12/15/2022 102 >59 mL/min/1.73 Final         Passed - Valid encounter within last 6 months    Recent Outpatient Visits           4 months ago Type II diabetes mellitus with complication [facility])   [place] [facility] Care & Sports Medicine at [facility], Nyoka Cowden, MD   9 months ago Annual physical exam   [facility] Primary Care & Sports Medicine at [facility] Nyoka Cowden, MD   1 year ago Type II diabetes mellitus with complication [facility])   [place] [facility] Care & Sports Medicine at [facility], Nyoka Cowden, MD   1 year ago Type II diabetes mellitus with complication [facility])   [place]  Primary Care & Sports Medicine at [facility], Nyoka Cowden, MD   1 year ago Annual physical exam   [facility] Primary [facility] at [facility], Nyoka Cowden, MD       Future Appointments             In 2 months Judithann Graves, Nyoka Cowden, MD [facility] Primary Care & Sports Medicine at [facility], [facility]

## 2023-05-26 ENCOUNTER — Other Ambulatory Visit: Payer: Self-pay | Admitting: Internal Medicine

## 2023-05-26 DIAGNOSIS — I10 Essential (primary) hypertension: Secondary | ICD-10-CM

## 2023-05-27 NOTE — Telephone Encounter (Signed)
Requested Prescriptions  Pending Prescriptions Disp Refills   lisinopril (ZESTRIL) 40 MG tablet [Pharmacy Med Name: lisinopril 40 mg tablet] 90 tablet 0    Sig: TAKE ONE TABLET BY MOUTH ONCE DAILY     Cardiovascular:  ACE Inhibitors Passed - 05/27/2023  8:13 AM      Passed - Cr in normal range and within 180 days    Creatinine, Ser  Date Value Ref Range Status  12/15/2022 0.78 0.76 - 1.27 mg/dL Final         Passed - K in normal range and within 180 days    Potassium  Date Value Ref Range Status  12/15/2022 4.8 3.5 - 5.2 mmol/L Final         Passed - Patient is not pregnant      Passed - Last BP in normal range    BP Readings from Last 1 Encounters:  12/15/22 116/68         Passed - Valid encounter within last 6 months    Recent Outpatient Visits           5 months ago Type II diabetes mellitus with complication [facility])   [place] Primary Care & Sports Medicine at [facility], Nyoka Cowden, MD   10 months ago Annual physical exam   [facility] Primary Care & Sports Medicine at [facility], Nyoka Cowden, MD   1 year ago Type II diabetes mellitus with complication [facility])   [place] Primary Care & Sports Medicine at [facility], Nyoka Cowden, MD   1 year ago Type II diabetes mellitus with complication [facility])   [place] Primary Care & Sports Medicine at [facility], Nyoka Cowden, MD   1 year ago Annual physical exam   [facility] Primary Care & Sports Medicine at [facility], Nyoka Cowden, MD       Future Appointments             In 1 month Judithann Graves, Nyoka Cowden, MD [facility] Primary Care & Sports Medicine at [facility], [facility]

## 2023-06-25 ENCOUNTER — Ambulatory Visit: Payer: BC Managed Care – PPO | Admitting: Internal Medicine

## 2023-06-25 ENCOUNTER — Encounter: Payer: Self-pay | Admitting: Internal Medicine

## 2023-06-25 VITALS — BP 126/82 | HR 106 | Ht 72.0 in | Wt 223.0 lb

## 2023-06-25 DIAGNOSIS — L409 Psoriasis, unspecified: Secondary | ICD-10-CM

## 2023-06-25 DIAGNOSIS — R198 Other specified symptoms and signs involving the digestive system and abdomen: Secondary | ICD-10-CM

## 2023-06-25 NOTE — Progress Notes (Signed)
Date:  06/25/2023   Name:  Russell Wright   DOB:  1960-11-13   MRN:  [id number]   Chief Complaint: Abdominal Pain (On and off. X 2 weeks. Feels bloated on and off. Last BM this morning. Normal- not hard. )  Abdominal Pain This is a new problem. The current episode started 1 to 4 weeks ago. The onset quality is undetermined. The problem occurs intermittently. Pain location: LLQ fullness esp when lying down. The patient is experiencing no pain. The quality of the pain is a sensation of fullness. The abdominal pain does not radiate. Associated symptoms include constipation (feels like his bowels are slightly sluggish). Pertinent negatives include no fever or headaches. The pain is aggravated by bowel movement (he feels better when he is able to evacuate well).    Review of Systems  Constitutional:  Negative for chills, fatigue, fever and unexpected weight change.  HENT:  Negative for trouble swallowing.   Respiratory:  Negative for chest tightness and shortness of breath.   Cardiovascular:  Negative for chest pain.  Gastrointestinal:  Positive for abdominal pain and constipation (feels like his bowels are slightly sluggish). Negative for blood in stool and rectal pain.  Neurological:  Negative for dizziness and headaches.  Psychiatric/Behavioral:  Negative for dysphoric mood and sleep disturbance. The patient is not nervous/anxious.      Lab Results  Component Value Date   NA 142 12/15/2022   K 4.8 12/15/2022   CO2 23 12/15/2022   GLUCOSE 103 (H) 12/15/2022   BUN 17 12/15/2022   CREATININE 0.78 12/15/2022   CALCIUM 10.2 12/15/2022   EGFR 102 12/15/2022   GFRNONAA 87 04/29/2021   Lab Results  Component Value Date   CHOL 155 07/08/2022   HDL 37 (L) 07/08/2022   LDLCALC 76 07/08/2022   TRIG 253 (H) 07/08/2022   CHOLHDL 4.2 07/08/2022   No results found for: "TSH" Lab Results  Component Value Date   HGBA1C 7.7 (H) 12/15/2022   Lab Results  Component Value Date   WBC 12.1  (H) 07/08/2022   HGB 17.1 07/08/2022   HCT 50.6 07/08/2022   MCV 88 07/08/2022   PLT 327 07/08/2022   Lab Results  Component Value Date   ALT 27 05/12/2022   AST 20 05/12/2022   ALKPHOS 62 05/12/2022   BILITOT 0.5 02/19/2022   No results found for: "25OHVITD2", "25OHVITD3", "VD25OH"   Patient Active Problem List   Diagnosis Date Noted   Primary insomnia 12/15/2022   Sleep disturbance 09/15/2017   Psoriatic arthritis (HCC) 06/17/2017   Essential hypertension 06/17/2017   Hyperlipidemia associated with type 2 diabetes mellitus (HCC) 06/17/2017   Type II diabetes mellitus with complication (HCC) 06/17/2017   Hair loss    Psoriasis, unspecified 09/19/2016    Allergies  Allergen Reactions   Metformin And Related Diarrhea and Other (See Comments)    Diarrhea causing dehydration    Past Surgical History:  Procedure Laterality Date   COLONOSCOPY  05/09/2017   cleared for 10 years   TYMPANOPLASTY     repair- 63 years old    Social History   Tobacco Use   Smoking status: Never   Smokeless tobacco: Never  Vaping Use   Vaping status: Never Used  Substance Use Topics   Alcohol use: Yes    Comment: rarely   Drug use: No     Medication list has been reviewed and updated.  Current Meds  Medication Sig   Accu-Chek Softclix Lancets lancets  1 each by Other route 3 (three) times daily.   atorvastatin (LIPITOR) 40 MG tablet TAKE ONE TABLET BY MOUTH ONCE DAILY   Blood Glucose Monitoring Suppl (ACCU-CHEK GUIDE) w/Device KIT See admin instructions.   Blood Glucose Monitoring Suppl DEVI 1 each by Does not apply route in the morning, at noon, and at bedtime. May substitute to any manufacturer covered by patient's insurance.   clobetasol cream (TEMOVATE) 0.05 % SMARTSIG:Sparingly Topical Twice Daily   Empagliflozin-linaGLIPtin (GLYXAMBI) 25-5 MG TABS take 1 tablet by mouth every morning AT 6AM   folic acid (FOLVITE) 1 MG tablet TAKE 2 TABLETS BY MOUTH ONCE DAILY WITH MEALS    Glucose Blood (BLOOD GLUCOSE TEST STRIPS) STRP May substitute to any manufacturer covered by patient's insurance. Test BS twice a day.   Guselkumab (TREMFYA [place]) Inject into the skin every 3 (three) months.   lisinopril (ZESTRIL) 40 MG tablet TAKE ONE TABLET BY MOUTH ONCE DAILY   meloxicam (MOBIC) 15 MG tablet Take 15 mg by mouth daily.   methotrexate 2.5 MG tablet TAKE 6 TABLETS BY MOUTH ONCE WEEKLY   [DISCONTINUED] tiZANidine (ZANAFLEX) 4 MG tablet TAKE ONE TABLET BY MOUTH EVERY 6 HOURS AS NEEDED FOR MUSCLE SPASMS       06/25/2023   10:29 AM 12/15/2022    3:19 PM 07/08/2022    9:13 AM 02/19/2022    9:42 AM  GAD 7 : Generalized Anxiety Score  Nervous, Anxious, on Edge 0 2 2 0  Control/stop worrying 2 3 3 1   Worry too much - different things 2 3 3 1   Trouble relaxing 3 2 1 1   Restless 0 3 2 1   Easily annoyed or irritable 2 2 2  0  Afraid - awful might happen 2 2 3  0  Total GAD 7 Score 11 17 16 4   Anxiety Difficulty Very difficult Somewhat difficult Not difficult at all Not difficult at all       06/25/2023   10:29 AM 12/15/2022    3:19 PM 07/08/2022    9:13 AM  Depression screen PHQ 2/9  Decreased Interest 0 0 0  Down, Depressed, Hopeless 0 0 1  PHQ - 2 Score 0 0 1  Altered sleeping 2 2 2   Tired, decreased energy 0 2 2  Change in appetite 0 0 0  Feeling bad or failure about yourself  0 0 0  Trouble concentrating 0 0 0  Moving slowly or fidgety/restless 0 0 0  Suicidal thoughts 0 0 0  PHQ-9 Score 2 4 5   Difficult doing work/chores Not difficult at all Not difficult at all Not difficult at all    BP Readings from Last 3 Encounters:  06/25/23 126/82  12/15/22 116/68  07/08/22 125/78    Physical Exam Vitals and nursing note reviewed.  Constitutional:      General: He is not in acute distress.    Appearance: He is well-developed.  HENT:     Head: Normocephalic and atraumatic.  Pulmonary:     Effort: Pulmonary effort is normal. No respiratory distress.  Abdominal:      General: Abdomen is flat. Bowel sounds are normal.     Palpations: Abdomen is soft. There is no hepatomegaly or splenomegaly.     Tenderness: There is no abdominal tenderness. There is no guarding or rebound.     Hernia: No hernia is present.  Skin:    General: Skin is warm and dry.     Findings: No rash.  Neurological:  Mental Status: He is alert and oriented to person, place, and time.  Psychiatric:        Mood and Affect: Mood normal.        Behavior: Behavior normal.     Wt Readings from Last 3 Encounters:  06/25/23 223 lb (101.2 kg)  12/15/22 219 lb (99.3 kg)  07/08/22 222 lb (100.7 kg)    BP 126/82   Pulse (!) 106   Ht 6' (1.829 m)   Wt 223 lb (101.2 kg)   SpO2 94%   BMI 30.24 kg/m   Assessment and Plan:  Problem List Items Addressed This Visit       Unprioritized   Psoriasis, unspecified   Now seeing Dr. Dellia Cloud and started [facility] with excellent response He is now tapering off MTX      Other Visit Diagnoses       Abdominal fullness in left flank    -  Primary   maybe from mild constipation vs early abdominal wall hernia no hernia on exam will try Colace or Miralax       No follow-ups on file.    Reubin Milan, MD [facility] Primary [facility] Medicine [place]

## 2023-06-25 NOTE — Assessment & Plan Note (Addendum)
Now seeing Dr. Dellia Cloud and started [facility] with excellent response He is now tapering off MTX

## 2023-06-25 NOTE — Patient Instructions (Signed)
Take Colace stool softener daily. . If that does not work,  try Miralax powder daily.

## 2023-07-20 ENCOUNTER — Encounter: Payer: Self-pay | Admitting: Internal Medicine

## 2023-07-20 ENCOUNTER — Ambulatory Visit: Payer: BC Managed Care – PPO | Admitting: Internal Medicine

## 2023-07-20 VITALS — BP 122/76 | HR 91 | Ht 72.0 in | Wt 221.0 lb

## 2023-07-20 DIAGNOSIS — Z0000 Encounter for general adult medical examination without abnormal findings: Secondary | ICD-10-CM

## 2023-07-20 DIAGNOSIS — E785 Hyperlipidemia, unspecified: Secondary | ICD-10-CM

## 2023-07-20 DIAGNOSIS — Z125 Encounter for screening for malignant neoplasm of prostate: Secondary | ICD-10-CM

## 2023-07-20 DIAGNOSIS — E1169 Type 2 diabetes mellitus with other specified complication: Secondary | ICD-10-CM

## 2023-07-20 DIAGNOSIS — Z23 Encounter for immunization: Secondary | ICD-10-CM

## 2023-07-20 DIAGNOSIS — E118 Type 2 diabetes mellitus with unspecified complications: Secondary | ICD-10-CM

## 2023-07-20 DIAGNOSIS — I10 Essential (primary) hypertension: Secondary | ICD-10-CM

## 2023-07-20 DIAGNOSIS — Z7984 Long term (current) use of oral hypoglycemic drugs: Secondary | ICD-10-CM

## 2023-07-20 DIAGNOSIS — L4050 Arthropathic psoriasis, unspecified: Secondary | ICD-10-CM

## 2023-07-20 MED ORDER — GLYXAMBI 25-5 MG PO TABS: 1.0000 | ORAL_TABLET | Freq: Every day | ORAL | 3 refills | Status: DC

## 2023-07-20 NOTE — Assessment & Plan Note (Signed)
 Blood sugars stable without hypoglycemic symptoms or events. Currently managed with Jardiance  and tradjenta. Changes made last visit are none. Lab Results  Component Value Date   HGBA1C 7.7 (H) 12/15/2022

## 2023-07-20 NOTE — Assessment & Plan Note (Signed)
 LDL is  Lab Results  Component Value Date   LDLCALC 76 07/08/2022   Current regimen is atorvastatin .  Tolerating medications well without issues.

## 2023-07-20 NOTE — Progress Notes (Signed)
 Date:  07/20/2023   Name:  Russell Wright   DOB:  March 09, 1961   MRN:  [id number]   Chief Complaint: Annual Exam Russell Wright is a 63 y.o. male who presents today for his Complete Annual Exam. He feels well. He reports exercising- none. He reports he is sleeping fairly well.   Health Maintenance  Topic Date Due   Zoster (Shingles) Vaccine (1 of 2) Never done   Pneumococcal Vaccination (2 of 2 - PCV) 03/27/2020   Eye exam for diabetics  09/12/2022   COVID-19 Vaccine (6 - 2024-25 season) 02/08/2023   Hemoglobin A1C  06/17/2023   Yearly kidney function blood test for diabetes  12/15/2023   Yearly kidney health urinalysis for diabetes  12/15/2023   Complete foot exam   07/19/2024   DTaP/Tdap/Td vaccine (2 - Td or Tdap) 04/15/2025   Colon Cancer Screening  05/19/2026   Flu Shot  Completed   Hepatitis C Screening  Completed   HIV Screening  Completed   HPV Vaccine  Aged Out    Lab Results  Component Value Date   PSA1 0.7 07/08/2022   PSA1 0.8 06/17/2021   PSA1 0.8 06/12/2020   PSA 0.6 04/22/2017    Hypertension This is a chronic problem. The problem is controlled. Pertinent negatives include no chest pain, headaches, palpitations or shortness of breath.  Diabetes He presents for his follow-up diabetic visit. He has type 2 diabetes mellitus. Pertinent negatives for hypoglycemia include no dizziness, headaches or nervousness/anxiousness. Pertinent negatives for diabetes include no chest pain, no fatigue and no weakness.  Hyperlipidemia The problem is controlled. Pertinent negatives include no chest pain, myalgias or shortness of breath. Current antihyperlipidemic treatment includes statins. The current treatment provides significant improvement of lipids.    Review of Systems  Constitutional:  Negative for fatigue and unexpected weight change.  Eyes:  Negative for visual disturbance.  Respiratory:  Negative for cough, chest tightness, shortness of breath and wheezing.    Cardiovascular:  Negative for chest pain, palpitations and leg swelling.  Gastrointestinal:  Negative for abdominal pain, constipation and diarrhea.  Genitourinary:  Negative for dysuria, hematuria and urgency.  Musculoskeletal:  Negative for arthralgias and myalgias.  Neurological:  Negative for dizziness, weakness, light-headedness and headaches.  Psychiatric/Behavioral:  Negative for dysphoric mood and sleep disturbance. The patient is not nervous/anxious.      Lab Results  Component Value Date   NA 142 12/15/2022   K 4.8 12/15/2022   CO2 23 12/15/2022   GLUCOSE 103 (H) 12/15/2022   BUN 17 12/15/2022   CREATININE 0.78 12/15/2022   CALCIUM  10.2 12/15/2022   EGFR 102 12/15/2022   GFRNONAA 87 04/29/2021   Lab Results  Component Value Date   CHOL 155 07/08/2022   HDL 37 (L) 07/08/2022   LDLCALC 76 07/08/2022   TRIG 253 (H) 07/08/2022   CHOLHDL 4.2 07/08/2022   No results found for: "TSH" Lab Results  Component Value Date   HGBA1C 7.7 (H) 12/15/2022   Lab Results  Component Value Date   WBC 12.1 (H) 07/08/2022   HGB 17.1 07/08/2022   HCT 50.6 07/08/2022   MCV 88 07/08/2022   PLT 327 07/08/2022   Lab Results  Component Value Date   ALT 27 05/12/2022   AST 20 05/12/2022   ALKPHOS 62 05/12/2022   BILITOT 0.5 02/19/2022   No results found for: "25OHVITD2", "25OHVITD3", "VD25OH"   Patient Active Problem List   Diagnosis Date Noted   Primary insomnia 12/15/2022  Sleep disturbance 09/15/2017   Psoriatic arthritis (HCC) 06/17/2017   Essential hypertension 06/17/2017   Hyperlipidemia associated with type 2 diabetes mellitus (HCC) 06/17/2017   Type II diabetes mellitus with complication (HCC) 06/17/2017   Hair loss    Psoriasis, unspecified 09/19/2016    Allergies  Allergen Reactions   Metformin  And Related Diarrhea and Other (See Comments)    Diarrhea causing dehydration    Past Surgical History:  Procedure Laterality Date   COLONOSCOPY  05/09/2017    cleared for 10 years   TYMPANOPLASTY     repair- 63 years old    Social History   Tobacco Use   Smoking status: Never   Smokeless tobacco: Never  Vaping Use   Vaping status: Never Used  Substance Use Topics   Alcohol use: Yes    Comment: rarely   Drug use: No     Medication list has been reviewed and updated.  Current Meds  Medication Sig   Accu-Chek Softclix Lancets lancets 1 each by Other route 3 (three) times daily.   atorvastatin  (LIPITOR) 40 MG tablet TAKE ONE TABLET BY MOUTH ONCE DAILY   Blood Glucose Monitoring Suppl (ACCU-CHEK GUIDE) w/Device KIT See admin instructions.   Blood Glucose Monitoring Suppl DEVI 1 each by Does not apply route in the morning, at noon, and at bedtime. May substitute to any manufacturer covered by patient's insurance.   clobetasol cream (TEMOVATE) 0.05 % SMARTSIG:Sparingly Topical Twice Daily   folic acid (FOLVITE) 1 MG tablet TAKE 2 TABLETS BY MOUTH ONCE DAILY WITH MEALS   Glucose Blood (BLOOD GLUCOSE TEST STRIPS) STRP May substitute to any manufacturer covered by patient's insurance. Test BS twice a day.   Guselkumab (TREMFYA ) Inject into the skin every 3 (three) months.   lisinopril  (ZESTRIL ) 40 MG tablet TAKE ONE TABLET BY MOUTH ONCE DAILY   meloxicam (MOBIC) 15 MG tablet Take 15 mg by mouth daily.   methotrexate 2.5 MG tablet TAKE 6 TABLETS BY MOUTH ONCE WEEKLY   [DISCONTINUED] Empagliflozin -linaGLIPtin (GLYXAMBI ) 25-5 MG TABS take 1 tablet by mouth every morning AT 6AM       07/20/2023    8:11 AM 06/25/2023   10:29 AM 12/15/2022    3:19 PM 07/08/2022    9:13 AM  GAD 7 : Generalized Anxiety Score  Nervous, Anxious, on Edge 0 0 2 2  Control/stop worrying 3 2 3 3   Worry too much - different things 3 2 3 3   Trouble relaxing 3 3 2 1   Restless 3 0 3 2  Easily annoyed or irritable 0 2 2 2   Afraid - awful might happen 1 2 2 3   Total GAD 7 Score 13 11 17 16   Anxiety Difficulty Somewhat difficult Very difficult Somewhat difficult Not  difficult at all       07/20/2023    8:11 AM 06/25/2023   10:29 AM 12/15/2022    3:19 PM  Depression screen PHQ 2/9  Decreased Interest 0 0 0  Down, Depressed, Hopeless 0 0 0  PHQ - 2 Score 0 0 0  Altered sleeping 0 2 2  Tired, decreased energy 1 0 2  Change in appetite 0 0 0  Feeling bad or failure about yourself  0 0 0  Trouble concentrating 1 0 0  Moving slowly or fidgety/restless 0 0 0  Suicidal thoughts 0 0 0  PHQ-9 Score 2 2 4   Difficult doing work/chores Not difficult at all Not difficult at all Not difficult at all  BP Readings from Last 3 Encounters:  07/20/23 122/76  06/25/23 126/82  12/15/22 116/68    Physical Exam Vitals and nursing note reviewed.  Constitutional:      Appearance: Normal appearance. He is well-developed.  HENT:     Head: Normocephalic.     Right Ear: Tympanic membrane, ear canal and external ear normal.     Left Ear: Tympanic membrane, ear canal and external ear normal.     Nose: Nose normal.  Eyes:     Conjunctiva/sclera: Conjunctivae normal.     Pupils: Pupils are equal, round, and reactive to light.  Neck:     Thyroid: No thyromegaly.     Vascular: No carotid bruit.  Cardiovascular:     Rate and Rhythm: Normal rate and regular rhythm.     Heart sounds: Normal heart sounds.  Pulmonary:     Effort: Pulmonary effort is normal.     Breath sounds: Normal breath sounds. No wheezing.  Chest:  Breasts:    Right: No mass.     Left: No mass.  Abdominal:     General: Bowel sounds are normal.     Palpations: Abdomen is soft.     Tenderness: There is no abdominal tenderness.  Musculoskeletal:        General: Normal range of motion.     Cervical back: Normal range of motion and neck supple.  Lymphadenopathy:     Cervical: No cervical adenopathy.  Skin:    General: Skin is warm and dry.  Neurological:     Mental Status: He is alert and oriented to person, place, and time.     Deep Tendon Reflexes: Reflexes are normal and symmetric.   Psychiatric:        Attention and Perception: Attention normal.        Mood and Affect: Mood normal.        Thought Content: Thought content normal.    Diabetic Foot Exam - Simple   Simple Foot Form Diabetic Foot exam was performed with the following findings: Yes 07/20/2023  8:20 AM  Visual Inspection No deformities, no ulcerations, no other skin breakdown bilaterally: Yes Sensation Testing Intact to touch and monofilament testing bilaterally: Yes Pulse Check Posterior Tibialis and Dorsalis pulse intact bilaterally: Yes Comments      Wt Readings from Last 3 Encounters:  07/20/23 221 lb (100.2 kg)  06/25/23 223 lb (101.2 kg)  12/15/22 219 lb (99.3 kg)    BP 122/76   Pulse 91   Ht 6' (1.829 m)   Wt 221 lb (100.2 kg)   SpO2 96%   BMI 29.97 kg/m   Assessment and Plan:  Problem List Items Addressed This Visit       Unprioritized   Essential hypertension (Chronic)   Controlled BP with normal exam. Current regimen is lisinopril . Will continue same medications; encourage continued reduced sodium diet.       Relevant Orders   Comprehensive metabolic panel   CBC with Differential/Platelet   CT CORONARY MORPH W/CTA COR W/SCORE W/CA W/CM &/OR WO/CM   Hyperlipidemia associated with type 2 diabetes mellitus (HCC) (Chronic)   LDL is  Lab Results  Component Value Date   LDLCALC 76 07/08/2022   Current regimen is atorvastatin .  Tolerating medications well without issues.       Relevant Medications   Empagliflozin -linaGLIPtin (GLYXAMBI ) 25-5 MG TABS   Other Relevant Orders   Lipid panel   CT CORONARY MORPH W/CTA COR W/SCORE W/CA W/CM &/OR WO/CM  Psoriatic arthritis (HCC) (Chronic)   Type II diabetes mellitus with complication (HCC) (Chronic)   Blood sugars stable without hypoglycemic symptoms or events. Currently managed with Jardiance  and tradjenta. Changes made last visit are none. Lab Results  Component Value Date   HGBA1C 7.7 (H) 12/15/2022          Relevant Medications   Empagliflozin -linaGLIPtin (GLYXAMBI ) 25-5 MG TABS   Other Relevant Orders   Hemoglobin A1c   Comprehensive metabolic panel   CT CORONARY MORPH W/CTA COR W/SCORE W/CA W/CM &/OR WO/CM   Other Visit Diagnoses       Annual physical exam    -  Primary   Will order Coronary Ca score due to high risk and + fam hx   Relevant Orders   PSA   Lipid panel   Hemoglobin A1c   Comprehensive metabolic panel   CBC with Differential/Platelet     Prostate cancer screening       Relevant Orders   PSA     Immunization due       Relevant Orders   Pneumococcal conjugate vaccine 20-valent     Long term current use of oral hypoglycemic drug           Return in about 4 months (around 11/17/2023) for DM.    Sheron Dixons, MD [facility] [place]

## 2023-07-20 NOTE — Assessment & Plan Note (Signed)
 Controlled BP with normal exam. Current regimen is lisinopril. Will continue same medications; encourage continued reduced sodium diet.

## 2023-07-21 ENCOUNTER — Encounter: Payer: Self-pay | Admitting: Internal Medicine

## 2023-07-21 LAB — COMPREHENSIVE METABOLIC PANEL
ALT: 25 [IU]/L (ref 0–44)
AST: 16 [IU]/L (ref 0–40)
Albumin: 4.6 g/dL (ref 3.8–4.9)
Alkaline Phosphatase: 72 [IU]/L (ref 44–121)
BUN/Creatinine Ratio: 17 (ref 10–24)
BUN: 15 mg/dL (ref 8–27)
Bilirubin Total: 0.5 mg/dL (ref 0.0–1.2)
CO2: 19 mmol/L — ABNORMAL LOW (ref 20–29)
Calcium: 10.1 mg/dL (ref 8.6–10.2)
Chloride: 103 mmol/L (ref 96–106)
Creatinine, Ser: 0.87 mg/dL (ref 0.76–1.27)
Globulin, Total: 2.7 g/dL (ref 1.5–4.5)
Glucose: 162 mg/dL — ABNORMAL HIGH (ref 70–99)
Potassium: 4.5 mmol/L (ref 3.5–5.2)
Sodium: 139 mmol/L (ref 134–144)
Total Protein: 7.3 g/dL (ref 6.0–8.5)
eGFR: 99 mL/min/{1.73_m2} (ref >59)

## 2023-07-21 LAB — CBC WITH DIFFERENTIAL/PLATELET
Basophils Absolute: 0.1 10*3/uL (ref 0.0–0.2)
Basos: 1 %
EOS (ABSOLUTE): 0.1 10*3/uL (ref 0.0–0.4)
Eos: 1 %
Hematocrit: 52.1 % — ABNORMAL HIGH (ref 37.5–51.0)
Hemoglobin: 17.3 g/dL (ref 13.0–17.7)
Immature Grans (Abs): 0.1 10*3/uL (ref 0.0–0.1)
Immature Granulocytes: 1 %
Lymphocytes Absolute: 1.7 10*3/uL (ref 0.7–3.1)
Lymphs: 17 %
MCH: 29.3 pg (ref 26.6–33.0)
MCHC: 33.2 g/dL (ref 31.5–35.7)
MCV: 88 fL (ref 79–97)
Monocytes Absolute: 0.7 10*3/uL (ref 0.1–0.9)
Monocytes: 7 %
Neutrophils Absolute: 7.7 10*3/uL — ABNORMAL HIGH (ref 1.4–7.0)
Neutrophils: 73 %
Platelets: 279 10*3/uL (ref 150–450)
RBC: 5.9 x10E6/uL — ABNORMAL HIGH (ref 4.14–5.80)
RDW: 13.3 % (ref 11.6–15.4)
WBC: 10.4 10*3/uL (ref 3.4–10.8)

## 2023-07-21 LAB — HEMOGLOBIN A1C
Est. average glucose Bld gHb Est-mCnc: 183 mg/dL
Hgb A1c MFr Bld: 8 % — ABNORMAL HIGH (ref 4.8–5.6)

## 2023-07-21 LAB — LIPID PANEL
Chol/HDL Ratio: 4.5 {ratio} (ref 0.0–5.0)
Cholesterol, Total: 163 mg/dL (ref 100–199)
HDL: 36 mg/dL — ABNORMAL LOW (ref >39)
LDL Chol Calc (NIH): 92 mg/dL (ref 0–99)
Triglycerides: 207 mg/dL — ABNORMAL HIGH (ref 0–149)
VLDL Cholesterol Cal: 35 mg/dL (ref 5–40)

## 2023-07-21 LAB — PSA: Prostate Specific Ag, Serum: 0.6 ng/mL (ref 0.0–4.0)

## 2023-07-27 ENCOUNTER — Other Ambulatory Visit: Payer: BC Managed Care – PPO

## 2023-08-03 ENCOUNTER — Telehealth: Payer: Self-pay | Admitting: Internal Medicine

## 2023-08-03 ENCOUNTER — Ambulatory Visit
Admission: RE | Admit: 2023-08-03 | Discharge: 2023-08-03 | Disposition: A | Discharge status: Home / Self Care | Payer: Self-pay | Source: Ambulatory Visit | Attending: Internal Medicine | Admitting: Internal Medicine

## 2023-08-03 ENCOUNTER — Other Ambulatory Visit: Payer: Self-pay | Admitting: Internal Medicine

## 2023-08-03 ENCOUNTER — Ambulatory Visit: Admission: RE | Admit: 2023-08-03 | Payer: BC Managed Care – PPO | Source: Ambulatory Visit

## 2023-08-03 ENCOUNTER — Encounter: Payer: Self-pay | Admitting: Internal Medicine

## 2023-08-03 DIAGNOSIS — E118 Type 2 diabetes mellitus with unspecified complications: Secondary | ICD-10-CM | POA: Insufficient documentation

## 2023-08-03 DIAGNOSIS — I10 Essential (primary) hypertension: Secondary | ICD-10-CM | POA: Insufficient documentation

## 2023-08-03 DIAGNOSIS — E1169 Type 2 diabetes mellitus with other specified complication: Secondary | ICD-10-CM

## 2023-08-03 DIAGNOSIS — E785 Hyperlipidemia, unspecified: Secondary | ICD-10-CM | POA: Insufficient documentation

## 2023-08-03 MED ORDER — SITAGLIPTIN PHOSPHATE 100 MG PO TABS: 100.0000 mg | ORAL_TABLET | Freq: Every day | ORAL | 0 refills | Status: DC

## 2023-08-03 MED ORDER — ATORVASTATIN CALCIUM 80 MG PO TABS: 80.0000 mg | ORAL_TABLET | Freq: Every day | ORAL | 1 refills | Status: DC

## 2023-08-03 MED ORDER — EMPAGLIFLOZIN 25 MG PO TABS: 25.0000 mg | ORAL_TABLET | Freq: Every day | ORAL | 0 refills | Status: DC

## 2023-08-03 NOTE — Telephone Encounter (Signed)
**Note De-identified Hilliary Jock Obfuscation** Please advise 

## 2023-08-03 NOTE — Telephone Encounter (Signed)
 Patient informed. Told him to call if any issues with getting medicine at the pharmacy.

## 2023-08-03 NOTE — Telephone Encounter (Signed)
 Prescription Request  08/03/2023 Patient came into office and stated his new insurance will not pay for this medication, and he has been without his medication for 10 days.   LOV: 07/20/2023   What is the name of the medication or equipment? GLYXAMBI 25 MG-5MG  TAB  Have you contacted your pharmacy to request a refill? YES   Which pharmacy would you like this sent to?    [facility], [address], [place], [place]  [address]   Patient notified that their request is being sent to the clinical staff for review and that they should receive a response within 2 business days.   Please advise Patient.

## 2023-08-04 ENCOUNTER — Telehealth: Payer: Self-pay

## 2023-08-04 NOTE — Telephone Encounter (Signed)
 Completed PA for Jardiance 25 mg.  (Key: [id number]) Rx #: [id number]  Awaiting outcome.  Phone: [phone] Fax: [phone]

## 2023-08-05 NOTE — Telephone Encounter (Signed)
 Insurance sent FAX to office stated "Our records indicate you do not have prescription drug coverage with [employer]."

## 2023-09-23 ENCOUNTER — Other Ambulatory Visit: Payer: Self-pay | Admitting: Internal Medicine

## 2023-09-23 DIAGNOSIS — I10 Essential (primary) hypertension: Secondary | ICD-10-CM

## 2023-09-24 NOTE — Telephone Encounter (Signed)
 Requested Prescriptions  Pending Prescriptions Disp Refills   lisinopril (ZESTRIL) 40 MG tablet [Pharmacy Med Name: lisinopril 40 mg tablet] 90 tablet 1    Sig: TAKE ONE TABLET BY MOUTH ONCE DAILY     Cardiovascular:  ACE Inhibitors Passed - 09/24/2023 10:01 AM      Passed - Cr in normal range and within 180 days    Creatinine, Ser  Date Value Ref Range Status  07/20/2023 0.87 0.76 - 1.27 mg/dL Final         Passed - K in normal range and within 180 days    Potassium  Date Value Ref Range Status  07/20/2023 4.5 3.5 - 5.2 mmol/L Final         Passed - Patient is not pregnant      Passed - Last BP in normal range    BP Readings from Last 1 Encounters:  07/20/23 122/76         Passed - Valid encounter within last 6 months    Recent Outpatient Visits           2 months ago Annual physical exam   [facility] at [facility], [place], MD       Future Appointments             In 1 month Gala Jubilee, [place], MD [facility] at [facility], [facility]

## 2023-10-09 LAB — HM DIABETES EYE EXAM

## 2023-10-14 ENCOUNTER — Other Ambulatory Visit: Payer: Self-pay | Admitting: Internal Medicine

## 2023-10-14 DIAGNOSIS — E118 Type 2 diabetes mellitus with unspecified complications: Secondary | ICD-10-CM

## 2023-10-16 NOTE — Telephone Encounter (Signed)
 Requested Prescriptions  Pending Prescriptions Disp Refills   JANUVIA  100 MG tablet [Pharmacy Med Name: Januvia  100 mg tablet] 90 tablet 0    Sig: TAKE ONE TABLET BY MOUTH ONCE DAILY     Endocrinology:  Diabetes - DPP-4 Inhibitors Failed - 10/16/2023  8:43 AM      Failed - HBA1C is between 0 and 7.9 and within 180 days    Hemoglobin A1C  Date Value Ref Range Status  04/22/2017 6.3  Final   Hgb A1c MFr Bld  Date Value Ref Range Status  07/20/2023 8.0 (H) 4.8 - 5.6 % Final    Comment:             Prediabetes: 5.7 - 6.4          Diabetes: >6.4          Glycemic control for adults with diabetes: <7.0          Passed - Cr in normal range and within 360 days    Creatinine, Ser  Date Value Ref Range Status  07/20/2023 0.87 0.76 - 1.27 mg/dL Final         Passed - Valid encounter within last 6 months    Recent Outpatient Visits           2 months ago Annual physical exam   [facility] at [facility], Chales [place], MD       Future Appointments             In 1 month Gala Jubilee [place], MD [facility] at [facility], [facility]

## 2023-11-20 ENCOUNTER — Encounter: Payer: Self-pay | Admitting: Internal Medicine

## 2023-11-20 ENCOUNTER — Ambulatory Visit: Payer: BC Managed Care – PPO | Admitting: Internal Medicine

## 2023-11-20 VITALS — BP 118/78 | HR 101 | Ht 72.0 in | Wt 218.1 lb

## 2023-11-20 DIAGNOSIS — E118 Type 2 diabetes mellitus with unspecified complications: Secondary | ICD-10-CM

## 2023-11-20 DIAGNOSIS — Z7984 Long term (current) use of oral hypoglycemic drugs: Secondary | ICD-10-CM | POA: Diagnosis not present

## 2023-11-20 DIAGNOSIS — F5101 Primary insomnia: Secondary | ICD-10-CM

## 2023-11-20 LAB — POCT GLYCOSYLATED HEMOGLOBIN (HGB A1C): Hemoglobin A1C: 8.3 % — AB (ref 4.0–5.6)

## 2023-11-20 MED ORDER — GLIMEPIRIDE 2 MG PO TABS: 2.0000 mg | ORAL_TABLET | Freq: Every day | ORAL | 1 refills | Status: DC

## 2023-11-20 NOTE — Assessment & Plan Note (Signed)
 Recommend Simply Sleep or Benadryl as needed.

## 2023-11-20 NOTE — Assessment & Plan Note (Addendum)
 Blood sugars have been stable.  No recent hypoglycemic events requiring assistance. Currently medications are Januvia  and Jardiance .  No Jardiance  in 2 weeks. Lab Results  Component Value Date   HGBA1C 8.0 (H) 07/20/2023   Last visit no changes were made - encouraged better diet choices. A1C today = 8.3. Will add glimepiride; consider pharmacy assistance next visit for Jardiance  if no improvement.

## 2023-11-20 NOTE — Progress Notes (Signed)
 Date:  11/20/2023   Name:  Russell Wright   DOB:  11-08-1960   MRN:  [id number]   Chief Complaint: Diabetes  Diabetes He presents for his follow-up diabetic visit. He has type 2 diabetes mellitus. Pertinent negatives for hypoglycemia include no headaches, nervousness/anxiousness or tremors. Pertinent negatives for diabetes include no chest pain, no fatigue, no polydipsia and no polyuria. Current diabetic treatments: Jardiance  and Januvia . He is compliant with treatment all of the time.  Insomnia Primary symptoms: sleep disturbance, difficulty falling asleep, frequent awakening.   The problem occurs nightly. Exacerbated by: family issues - mother with lung cancer.    Review of Systems  Constitutional:  Negative for appetite change, fatigue and unexpected weight change.  Eyes:  Negative for visual disturbance.  Respiratory:  Negative for cough, shortness of breath and wheezing.   Cardiovascular:  Negative for chest pain, palpitations and leg swelling.  Gastrointestinal:  Negative for abdominal pain and blood in stool.  Endocrine: Negative for polydipsia and polyuria.  Genitourinary:  Negative for dysuria and hematuria.  Skin:  Negative for color change and rash.  Neurological:  Negative for tremors, numbness and headaches.  Psychiatric/Behavioral:  Positive for sleep disturbance. Negative for dysphoric mood. The patient has insomnia. The patient is not nervous/anxious.      Lab Results  Component Value Date   NA 139 07/20/2023   K 4.5 07/20/2023   CO2 19 (L) 07/20/2023   GLUCOSE 162 (H) 07/20/2023   BUN 15 07/20/2023   CREATININE 0.87 07/20/2023   CALCIUM  10.1 07/20/2023   EGFR 99 07/20/2023   GFRNONAA 87 04/29/2021   Lab Results  Component Value Date   CHOL 163 07/20/2023   HDL 36 (L) 07/20/2023   LDLCALC 92 07/20/2023   TRIG 207 (H) 07/20/2023   CHOLHDL 4.5 07/20/2023   No results found for: TSH Lab Results  Component Value Date   HGBA1C 8.3 (A) 11/20/2023    Lab Results  Component Value Date   WBC 10.4 07/20/2023   HGB 17.3 07/20/2023   HCT 52.1 (H) 07/20/2023   MCV 88 07/20/2023   PLT 279 07/20/2023   Lab Results  Component Value Date   ALT 25 07/20/2023   AST 16 07/20/2023   ALKPHOS 72 07/20/2023   BILITOT 0.5 07/20/2023   No results found for: Lucetta Russel, VD25OH   Patient Active Problem List   Diagnosis Date Noted   Primary insomnia 12/15/2022   Psoriatic arthritis (HCC) 06/17/2017   Essential hypertension 06/17/2017   Hyperlipidemia associated with type 2 diabetes mellitus (HCC) 06/17/2017   Type II diabetes mellitus with complication (HCC) 06/17/2017   Hair loss    Psoriasis, unspecified 09/19/2016    Allergies  Allergen Reactions   Metformin  And Related Diarrhea and Other (See Comments)    Diarrhea causing dehydration    Past Surgical History:  Procedure Laterality Date   COLONOSCOPY  05/09/2017   cleared for 10 years   TYMPANOPLASTY     repair- 63 years old    Social History   Tobacco Use   Smoking status: Never   Smokeless tobacco: Never  Vaping Use   Vaping status: Never Used  Substance Use Topics   Alcohol use: Yes    Comment: rarely   Drug use: No     Medication list has been reviewed and updated.  Current Meds  Medication Sig   atorvastatin  (LIPITOR) 80 MG tablet Take 1 tablet (80 mg total) by mouth daily.   Blood Glucose  Monitoring Suppl (ACCU-CHEK GUIDE) w/Device KIT See admin instructions.   clobetasol cream (TEMOVATE) 0.05 % SMARTSIG:Sparingly Topical Twice Daily (Patient taking differently: as needed.)   folic acid (FOLVITE) 1 MG tablet TAKE 2 TABLETS BY MOUTH ONCE DAILY WITH MEALS   glimepiride (AMARYL) 2 MG tablet Take 1 tablet (2 mg total) by mouth daily before breakfast.   Glucose Blood (BLOOD GLUCOSE TEST STRIPS) STRP May substitute to any manufacturer covered by patient's insurance. Test BS twice a day.   Guselkumab (TREMFYA [place]) Inject into the skin every 3  (three) months.   JANUVIA  100 MG tablet TAKE ONE TABLET BY MOUTH ONCE DAILY   lisinopril  (ZESTRIL ) 40 MG tablet TAKE ONE TABLET BY MOUTH ONCE DAILY   meloxicam (MOBIC) 15 MG tablet Take 15 mg by mouth daily.       11/20/2023    8:08 AM 07/20/2023    8:11 AM 06/25/2023   10:29 AM 12/15/2022    3:19 PM  GAD 7 : Generalized Anxiety Score  Nervous, Anxious, on Edge 3 0 0 2  Control/stop worrying 3 3 2 3   Worry too much - different things 3 3 2 3   Trouble relaxing 3 3 3 2   Restless 3 3 0 3  Easily annoyed or irritable 2 0 2 2  Afraid - awful might happen 3 1 2 2   Total GAD 7 Score 20 13 11 17   Anxiety Difficulty Somewhat difficult Somewhat difficult Very difficult Somewhat difficult       11/20/2023    8:08 AM 07/20/2023    8:11 AM 06/25/2023   10:29 AM  Depression screen PHQ 2/9  Decreased Interest 2 0 0  Down, Depressed, Hopeless 1 0 0  PHQ - 2 Score 3 0 0  Altered sleeping 3 0 2  Tired, decreased energy 3 1 0  Change in appetite 0 0 0  Feeling bad or failure about yourself  0 0 0  Trouble concentrating 2 1 0  Moving slowly or fidgety/restless 0 0 0  Suicidal thoughts 0 0 0  PHQ-9 Score 11 2 2   Difficult doing work/chores Somewhat difficult Not difficult at all Not difficult at all    BP Readings from Last 3 Encounters:  11/20/23 118/78  07/20/23 122/76  06/25/23 126/82    Physical Exam Vitals and nursing note reviewed.  Constitutional:      General: He is not in acute distress.    Appearance: He is well-developed.  HENT:     Head: Normocephalic and atraumatic.   Cardiovascular:     Rate and Rhythm: Normal rate and regular rhythm.     Heart sounds: No murmur heard. Pulmonary:     Effort: Pulmonary effort is normal. No respiratory distress.     Breath sounds: No wheezing or rhonchi.   Musculoskeletal:     Cervical back: Normal range of motion.     Right lower leg: No edema.     Left lower leg: No edema.  Lymphadenopathy:     Cervical: No cervical adenopathy.    Skin:    General: Skin is warm and dry.     Capillary Refill: Capillary refill takes less than 2 seconds.     Findings: No rash.   Neurological:     General: No focal deficit present.     Mental Status: He is alert and oriented to person, place, and time.   Psychiatric:        Mood and Affect: Mood normal.  Behavior: Behavior normal.     Wt Readings from Last 3 Encounters:  11/20/23 218 lb 2 oz (98.9 kg)  07/20/23 221 lb (100.2 kg)  06/25/23 223 lb (101.2 kg)    BP 118/78   Pulse (!) 101   Ht 6' (1.829 m)   Wt 218 lb 2 oz (98.9 kg)   SpO2 97%   BMI 29.58 kg/m   Assessment and Plan:  Problem List Items Addressed This Visit       Unprioritized   Type II diabetes mellitus with complication (HCC) - Primary (Chronic)   Blood sugars have been stable.  No recent hypoglycemic events requiring assistance. Currently medications are Januvia  and Jardiance .  No Jardiance  in 2 weeks. Lab Results  Component Value Date   HGBA1C 8.0 (H) 07/20/2023   Last visit no changes were made - encouraged better diet choices. A1C today = 8.3. Will add glimepiride; consider pharmacy assistance next visit for Jardiance  if no improvement.       Relevant Medications   glimepiride (AMARYL) 2 MG tablet   Other Relevant Orders   POCT glycosylated hemoglobin (Hb A1C) (Completed)   Microalbumin / creatinine urine ratio   Primary insomnia   Recommend Simply Sleep or Benadryl as needed.      Other Visit Diagnoses       Long term current use of oral hypoglycemic drug           Return in about 4 months (around 03/21/2024) for DM.    Sheron Dixons, MD [facility] [place]

## 2023-11-22 LAB — MICROALBUMIN / CREATININE URINE RATIO
Creatinine, Urine: 72.3 mg/dL
Microalb/Creat Ratio: 235 mg/g{creat} — ABNORMAL HIGH (ref 0–29)
Microalbumin, Urine: 169.7 ug/mL

## 2023-11-23 ENCOUNTER — Telehealth: Payer: Self-pay

## 2023-11-23 NOTE — Telephone Encounter (Signed)
 Received FAX from [email or web address] for Jardiance  25 MG.  KEY: [id number]  Thanks!

## 2023-11-24 ENCOUNTER — Other Ambulatory Visit (HOSPITAL_COMMUNITY): Payer: Self-pay

## 2023-11-24 ENCOUNTER — Telehealth: Payer: Self-pay | Admitting: Pharmacy Technician

## 2023-11-24 NOTE — Telephone Encounter (Signed)
 Pharmacy Patient Advocate Encounter  Received notification from [employer] that Prior Authorization for Jardiance  25MG  tablets has been APPROVED from 10/25/2023 to 11/23/2024. Ran test claim, Copay is $60.00. This test claim was processed through [facility]- copay amounts may vary at other pharmacies due to pharmacy/plan contracts, or as the patient moves through the different stages of their insurance plan.   PA #/Case ID/Reference #: [id number]

## 2023-11-24 NOTE — Telephone Encounter (Signed)
 Pharmacy Patient Advocate Encounter   Received notification from Pt Calls Messages that prior authorization for Jardiance  25MG  tablets is required/requested.   Insurance verification completed.   The patient is insured through [employer] .   Per test claim: PA required; PA submitted to above mentioned insurance via CoverMyMeds Key/confirmation #/EOC [id number] Status is pending

## 2023-11-24 NOTE — Telephone Encounter (Signed)
 PA request has been Approved. New Encounter has been or will be created for follow up. For additional info see Pharmacy Prior Auth telephone encounter from 11/24/2023.

## 2023-11-24 NOTE — Telephone Encounter (Signed)
 Forwarding to [facility].  Nurse Triage does not do prior authorizations.

## 2023-12-23 ENCOUNTER — Ambulatory Visit: Admitting: Internal Medicine

## 2023-12-23 ENCOUNTER — Encounter: Payer: Self-pay | Admitting: Internal Medicine

## 2023-12-23 VITALS — BP 138/86 | HR 71 | Ht 72.0 in | Wt 229.0 lb

## 2023-12-23 DIAGNOSIS — Z7984 Long term (current) use of oral hypoglycemic drugs: Secondary | ICD-10-CM

## 2023-12-23 DIAGNOSIS — E118 Type 2 diabetes mellitus with unspecified complications: Secondary | ICD-10-CM | POA: Diagnosis not present

## 2023-12-23 DIAGNOSIS — H6123 Impacted cerumen, bilateral: Secondary | ICD-10-CM

## 2023-12-23 MED ORDER — SITAGLIPTIN PHOSPHATE 100 MG PO TABS
100.0000 mg | ORAL_TABLET | Freq: Every day | ORAL | 1 refills | Status: DC
Start: 2023-12-23 — End: 2023-12-30

## 2023-12-23 MED ORDER — ACETIC ACID 2 % OT SOLN: 4.0000 [drp] | Freq: Three times a day (TID) | OTIC | 0 refills | Status: AC | PRN

## 2023-12-23 NOTE — Progress Notes (Signed)
 Date:  12/23/2023   Name:  Russell Wright   DOB:  [birth year]   MRN:  [id number]   Chief Complaint: Ear Problem (Rt ear- X 1 week. Feels like fluid is in ear. Not painful. Tried to get in with ENT and they could not see him sooner. )  Otalgia  There is pain in the right ear. This is a new problem. The current episode started in the past 7 days. Pertinent negatives include no ear discharge.  Diabetes He presents for his follow-up diabetic visit. He has type 2 diabetes mellitus. Pertinent negatives for hypoglycemia include no nervousness/anxiousness. Pertinent negatives for diabetes include no chest pain and no fatigue. Current diabetic treatment includes oral agent (monotherapy) (glimepiridie; not taking Januvia ).    Review of Systems  Constitutional:  Negative for chills, fatigue and fever.  HENT:  Positive for ear pain. Negative for congestion and ear discharge.   Respiratory:  Negative for chest tightness and shortness of breath.   Cardiovascular:  Negative for chest pain and palpitations.  Psychiatric/Behavioral:  Negative for dysphoric mood and sleep disturbance. The patient is not nervous/anxious.      Lab Results  Component Value Date   NA 139 07/20/2023   K 4.5 07/20/2023   CO2 19 (L) 07/20/2023   GLUCOSE 162 (H) 07/20/2023   BUN 15 07/20/2023   CREATININE 0.87 07/20/2023   CALCIUM  10.1 07/20/2023   EGFR 99 07/20/2023   GFRNONAA 87 04/29/2021   Lab Results  Component Value Date   CHOL 163 07/20/2023   HDL 36 (L) 07/20/2023   LDLCALC 92 07/20/2023   TRIG 207 (H) 07/20/2023   CHOLHDL 4.5 07/20/2023   No results found for: TSH Lab Results  Component Value Date   HGBA1C 8.3 (A) 11/20/2023   Lab Results  Component Value Date   WBC 10.4 07/20/2023   HGB 17.3 07/20/2023   HCT 52.1 (H) 07/20/2023   MCV 88 07/20/2023   PLT 279 07/20/2023   Lab Results  Component Value Date   ALT 25 07/20/2023   AST 16 07/20/2023   ALKPHOS 72 07/20/2023   BILITOT 0.5  07/20/2023   No results found for: MARIEN BOLLS, VD25OH   Patient Active Problem List   Diagnosis Date Noted   Primary insomnia 12/15/2022   Psoriatic arthritis (HCC) 06/17/2017   Essential hypertension 06/17/2017   Hyperlipidemia associated with type 2 diabetes mellitus (HCC) 06/17/2017   Type II diabetes mellitus with complication (HCC) 06/17/2017   Hair loss    Psoriasis, unspecified 09/19/2016    Allergies  Allergen Reactions   Metformin  And Related Diarrhea and Other (See Comments)    Diarrhea causing dehydration    Past Surgical History:  Procedure Laterality Date   COLONOSCOPY  05/09/2017   cleared for 10 years   TYMPANOPLASTY     repair- 63 years old    Social History   Tobacco Use   Smoking status: Never   Smokeless tobacco: Never  Vaping Use   Vaping status: Never Used  Substance Use Topics   Alcohol use: Yes    Comment: rarely   Drug use: No     Medication list has been reviewed and updated.  Current Meds  Medication Sig   Accu-Chek Softclix Lancets lancets 1 each by Other route 3 (three) times daily.   acetic acid  2 % otic solution Place 4 drops into both ears 3 (three) times daily as needed.   atorvastatin  (LIPITOR) 80 MG tablet Take 1 tablet (80  mg total) by mouth daily.   Blood Glucose Monitoring Suppl (ACCU-CHEK GUIDE) w/Device KIT See admin instructions.   Blood Glucose Monitoring Suppl DEVI 1 each by Does not apply route in the morning, at noon, and at bedtime. May substitute to any manufacturer covered by patient's insurance.   clobetasol cream (TEMOVATE) 0.05 % SMARTSIG:Sparingly Topical Twice Daily (Patient taking differently: as needed.)   folic acid (FOLVITE) 1 MG tablet TAKE 2 TABLETS BY MOUTH ONCE DAILY WITH MEALS   glimepiride  (AMARYL ) 2 MG tablet Take 1 tablet (2 mg total) by mouth daily before breakfast.   Glucose Blood (BLOOD GLUCOSE TEST STRIPS) STRP May substitute to any manufacturer covered by patient's insurance.  Test BS twice a day.   Guselkumab (TREMFYA [place]) Inject into the skin every 3 (three) months.   lisinopril  (ZESTRIL ) 40 MG tablet TAKE ONE TABLET BY MOUTH ONCE DAILY   meloxicam (MOBIC) 15 MG tablet Take 15 mg by mouth daily.   [DISCONTINUED] empagliflozin  (JARDIANCE ) 25 MG TABS tablet Take 1 tablet (25 mg total) by mouth daily before breakfast.   [DISCONTINUED] JANUVIA  100 MG tablet TAKE ONE TABLET BY MOUTH ONCE DAILY       12/23/2023    7:57 AM 11/20/2023    8:08 AM 07/20/2023    8:11 AM 06/25/2023   10:29 AM  GAD 7 : Generalized Anxiety Score  Nervous, Anxious, on Edge 0 3 0 0  Control/stop worrying 1 3 3 2   Worry too much - different things 1 3 3 2   Trouble relaxing 1 3 3 3   Restless 1 3 3  0  Easily annoyed or irritable 0 2 0 2  Afraid - awful might happen 0 3 1 2   Total GAD 7 Score 4 20 13 11   Anxiety Difficulty Not difficult at all Somewhat difficult Somewhat difficult Very difficult       12/23/2023    7:57 AM 11/20/2023    8:08 AM 07/20/2023    8:11 AM  Depression screen PHQ 2/9  Decreased Interest 0 2 0  Down, Depressed, Hopeless 0 1 0  PHQ - 2 Score 0 3 0  Altered sleeping 1 3 0  Tired, decreased energy 0 3 1  Change in appetite 0 0 0  Feeling bad or failure about yourself  1 0 0  Trouble concentrating 0 2 1  Moving slowly or fidgety/restless 0 0 0  Suicidal thoughts 0 0 0  PHQ-9 Score 2 11 2   Difficult doing work/chores Not difficult at all Somewhat difficult Not difficult at all    BP Readings from Last 3 Encounters:  12/23/23 138/86  11/20/23 118/78  07/20/23 122/76    Physical Exam Vitals and nursing note reviewed.  Constitutional:      General: He is not in acute distress.    Appearance: He is well-developed.  HENT:     Head: Normocephalic and atraumatic.     Right Ear: Tympanic membrane normal.     Left Ear: Tympanic membrane normal.     Ears:     Comments: Excessive cerumen Few loose hairs Cardiovascular:     Rate and Rhythm: Normal rate and  regular rhythm.  Pulmonary:     Effort: Pulmonary effort is normal. No respiratory distress.     Breath sounds: No wheezing or rhonchi.  Skin:    General: Skin is warm and dry.     Findings: No rash.  Neurological:     Mental Status: He is alert and oriented to person, place,  and time.  Psychiatric:        Mood and Affect: Mood normal.        Behavior: Behavior normal.     Wt Readings from Last 3 Encounters:  12/23/23 229 lb (103.9 kg)  11/20/23 218 lb 2 oz (98.9 kg)  07/20/23 221 lb (100.2 kg)    BP 138/86   Pulse 71   Ht 6' (1.829 m)   Wt 229 lb (103.9 kg)   SpO2 100%   BMI 31.06 kg/m   Assessment and Plan:  Problem List Items Addressed This Visit       Unprioritized   Type II diabetes mellitus with complication (HCC) (Chronic)   He is not taking Januvia  for unclear reasons He could not afford Jardiance  Started glimepiride  last visit Recommend he check his BS regularly and call if high so I can initiate assistance for Jardiance       Relevant Medications   sitaGLIPtin  (JANUVIA ) 100 MG tablet   Other Visit Diagnoses       Excessive cerumen in both ear canals    -  Primary   will try Volsol drops for itching/irritation     Long term current use of oral hypoglycemic drug           No follow-ups on file.    Leita HILARIO Adie, MD [facility] [place]

## 2023-12-23 NOTE — Assessment & Plan Note (Signed)
 He is not taking Januvia  for unclear reasons He could not afford Jardiance  Started glimepiride  last visit Recommend he check his BS regularly and call if high so I can initiate assistance for Jardiance 

## 2023-12-27 ENCOUNTER — Encounter: Payer: Self-pay | Admitting: Internal Medicine

## 2023-12-29 ENCOUNTER — Telehealth: Payer: Self-pay

## 2023-12-29 ENCOUNTER — Other Ambulatory Visit: Payer: Self-pay

## 2023-12-29 DIAGNOSIS — E118 Type 2 diabetes mellitus with unspecified complications: Secondary | ICD-10-CM

## 2023-12-29 NOTE — Progress Notes (Signed)
 Care Guide Pharmacy Note  12/29/2023 Name: Russell Wright MRN: [id number] DOB: 11-Jul-1960  Referred By: Justus Leita DEL, MD Reason for referral: Complex Care Management (Outreach to schedule with Pharm d )   Russell Wright is a 63 y.o. year old male who is a primary care patient of Justus Leita DEL, MD.  Russell Wright was referred to the pharmacist for assistance related to: DMII  Successful contact was made with the patient to discuss pharmacy services including being ready for the pharmacist to call at least 5 minutes before the scheduled appointment time and to have medication bottles and any blood pressure readings ready for review. The patient agreed to meet with the pharmacist via telephone visit on (date/time).12/30/2023  Jeoffrey Buffalo , RMA     [place]  [facility], [facility] Guide  Direct Dial: [phone]  Website: [email or web address]

## 2023-12-30 ENCOUNTER — Telehealth: Payer: Self-pay | Admitting: Pharmacist

## 2023-12-30 ENCOUNTER — Telehealth: Payer: Self-pay

## 2023-12-30 ENCOUNTER — Other Ambulatory Visit: Payer: Self-pay | Admitting: Internal Medicine

## 2023-12-30 ENCOUNTER — Other Ambulatory Visit (HOSPITAL_COMMUNITY): Payer: Self-pay

## 2023-12-30 ENCOUNTER — Other Ambulatory Visit: Payer: Self-pay | Admitting: Pharmacist

## 2023-12-30 DIAGNOSIS — E118 Type 2 diabetes mellitus with unspecified complications: Secondary | ICD-10-CM

## 2023-12-30 MED ORDER — EMPAGLIFLOZIN 25 MG PO TABS: 25.0000 mg | ORAL_TABLET | Freq: Every day | ORAL | 1 refills | Status: DC

## 2023-12-30 NOTE — Progress Notes (Signed)
 Dr. Justus,  Reached patient today regarding medication access. Found that Januvia  is unaffordable (cost >$300), butJardiance  is $10 through his insurance plan + savings card.  Would you like for him to restart Jardiance  in place of Januvia ?  Also, patient currently taking glimepiride  2 mg daily before breakfast. He has not been monitoring blood sugar (has ordered new monitor), but reports recent episode of excessive sweating and dizziness that was relieved by resting & eating a peanut butter sandwich (unsure if symptoms due to being outside in heat or blood sugar after a small breakfast).   Per patient/refill record, appears patient was off of both Januvia  and Jardiance  prior to latest A1C  Would you want him to continue glimepiride  as well or switch therapy?  Thank you!  Sharyle Sia, PharmD, [facility] [phone]

## 2023-12-30 NOTE — Telephone Encounter (Signed)
 Pharmacy Patient Advocate Encounter  Insurance verification completed.   The patient is insured through [employer]   Ran test claim for Januvia . Currently a quantity of 30 is a 30 day supply and the co-pay is $309.87 .   This test claim was processed through [facility]- copay amounts may vary at other pharmacies due to pharmacy/plan contracts, or as the patient moves through the different stages of their insurance plan.

## 2023-12-30 NOTE — Progress Notes (Unsigned)
 Date:  12/30/2023   Name:  Russell Wright   DOB:  October 07, 1960   MRN:  [id number]   Chief Complaint: No chief complaint on file.  HPI  Review of Systems   Lab Results  Component Value Date   NA 139 07/20/2023   K 4.5 07/20/2023   CO2 19 (L) 07/20/2023   GLUCOSE 162 (H) 07/20/2023   BUN 15 07/20/2023   CREATININE 0.87 07/20/2023   CALCIUM  10.1 07/20/2023   EGFR 99 07/20/2023   GFRNONAA 87 04/29/2021   Lab Results  Component Value Date   CHOL 163 07/20/2023   HDL 36 (L) 07/20/2023   LDLCALC 92 07/20/2023   TRIG 207 (H) 07/20/2023   CHOLHDL 4.5 07/20/2023   No results found for: TSH Lab Results  Component Value Date   HGBA1C 8.3 (A) 11/20/2023   Lab Results  Component Value Date   WBC 10.4 07/20/2023   HGB 17.3 07/20/2023   HCT 52.1 (H) 07/20/2023   MCV 88 07/20/2023   PLT 279 07/20/2023   Lab Results  Component Value Date   ALT 25 07/20/2023   AST 16 07/20/2023   ALKPHOS 72 07/20/2023   BILITOT 0.5 07/20/2023   No results found for: MARIEN BOLLS, VD25OH   Patient Active Problem List   Diagnosis Date Noted   Primary insomnia 12/15/2022   Psoriatic arthritis (HCC) 06/17/2017   Essential hypertension 06/17/2017   Hyperlipidemia associated with type 2 diabetes mellitus (HCC) 06/17/2017   Type II diabetes mellitus with complication (HCC) 06/17/2017   Hair loss    Psoriasis, unspecified 09/19/2016    Allergies  Allergen Reactions   Metformin  And Related Diarrhea and Other (See Comments)    Diarrhea causing dehydration    Past Surgical History:  Procedure Laterality Date   COLONOSCOPY  05/09/2017   cleared for 10 years   TYMPANOPLASTY     repair- 63 years old    Social History   Tobacco Use   Smoking status: Never   Smokeless tobacco: Never  Vaping Use   Vaping status: Never Used  Substance Use Topics   Alcohol use: Yes    Comment: rarely   Drug use: No     Medication list has been reviewed and updated.  No  outpatient medications have been marked as taking for the 12/30/23 encounter (Orders Only) with Justus Leita DEL, MD.       12/23/2023    7:57 AM 11/20/2023    8:08 AM 07/20/2023    8:11 AM 06/25/2023   10:29 AM  GAD 7 : Generalized Anxiety Score  Nervous, Anxious, on Edge 0 3 0 0  Control/stop worrying 1 3 3 2   Worry too much - different things 1 3 3 2   Trouble relaxing 1 3 3 3   Restless 1 3 3  0  Easily annoyed or irritable 0 2 0 2  Afraid - awful might happen 0 3 1 2   Total GAD 7 Score 4 20 13 11   Anxiety Difficulty Not difficult at all Somewhat difficult Somewhat difficult Very difficult       12/23/2023    7:57 AM 11/20/2023    8:08 AM 07/20/2023    8:11 AM  Depression screen PHQ 2/9  Decreased Interest 0 2 0  Down, Depressed, Hopeless 0 1 0  PHQ - 2 Score 0 3 0  Altered sleeping 1 3 0  Tired, decreased energy 0 3 1  Change in appetite 0 0 0  Feeling bad or failure  about yourself  1 0 0  Trouble concentrating 0 2 1  Moving slowly or fidgety/restless 0 0 0  Suicidal thoughts 0 0 0  PHQ-9 Score 2 11 2   Difficult doing work/chores Not difficult at all Somewhat difficult Not difficult at all    BP Readings from Last 3 Encounters:  12/23/23 138/86  11/20/23 118/78  07/20/23 122/76    Physical Exam  Wt Readings from Last 3 Encounters:  12/23/23 229 lb (103.9 kg)  11/20/23 218 lb 2 oz (98.9 kg)  07/20/23 221 lb (100.2 kg)    There were no vitals taken for this visit.  Assessment and Plan:  Problem List Items Addressed This Visit   None   No follow-ups on file.    Leita HILARIO Adie, MD [facility] [place]

## 2023-12-30 NOTE — Progress Notes (Unsigned)
 12/30/2023 Name: Russell Wright MRN: [id number] DOB: 09-18-1960  Chief Complaint  Patient presents with   Medication Assistance    Russell Wright is a 63 y.o. year old male who presented for a telephone visit.   They were referred to the pharmacist by their PCP for assistance in managing medication access.    Subjective:  Care Team: Primary Care Provider: Justus Leita DEL, MD ; Next Scheduled Visit: 03/21/2024  Medication Access/Adherence  Current Pharmacy:  [facility] - [place], [place] - [address] [place] [place] [address] Phone: [phone] Fax: [phone]  [id number] [place], [place] - [address] AT [facility] [address] [place] [address] Phone: [phone] Fax: [phone]   Patient reports affordability concerns with their medications: Yes  Patient reports access/transportation concerns to their pharmacy: No  Patient reports adherence concerns with their medications:  No    Today reports cost of Januvia  has become unaffordable    Diabetes:  Current medications: glimepiride  2 mg daily before breakfast  Medications tried in the past: metformin  (diarrhea), Jardiance  (cost), Januvia  (cost)  Denies checking home blood sugar recently as needing new monitor; has ordered new monitor  Admits recently had an episode of excessive sweating and dizziness. Unsure if related to his blood sugar or being outside in heat, but did improve with eating a peanut butter sandwich and lying down. Reports occurred after eating small breakfast (only grits)  Statin therapy: atorvastatin  80 mg daily  Objective:  Lab Results  Component Value Date   HGBA1C 8.3 (A) 11/20/2023    Lab Results  Component Value Date   CREATININE 0.87 07/20/2023   BUN 15 07/20/2023   NA 139 07/20/2023   K 4.5 07/20/2023   CL 103 07/20/2023   CO2 19 (L) 07/20/2023      Medications Reviewed Today     Reviewed by Alana Sharyle LABOR, RPH-CPP (Pharmacist) on 12/30/23 at 1551  Med List Status: <None>   Medication Order Taking? Sig Documenting Provider Last Dose Status Informant  Accu-Chek Softclix Lancets lancets [id number]  1 each by Other route 3 (three) times daily. [provider]  Active   acetic acid  2 % otic solution [id number]  Place 4 drops into both ears 3 (three) times daily as needed. Justus Leita DEL, MD  Active   atorvastatin  (LIPITOR) 80 MG tablet [id number]  Take 1 tablet (80 mg total) by mouth daily. Justus Leita DEL, MD  Active   Blood Glucose Monitoring Suppl (ACCU-CHEK GUIDE) w/Device PRESSLEY [id number]  See admin instructions. [provider]  Active   Blood Glucose Monitoring Suppl DEVI [id number]  1 each by Does not apply route in the morning, at noon, and at bedtime. May substitute to any manufacturer covered by patient's insurance. Justus Leita DEL, MD  Active   clobetasol cream (TEMOVATE) 0.05 % [id number]  SMARTSIG:Sparingly Topical Twice Daily  Patient taking differently: as needed.   [provider]  Active   folic acid (FOLVITE) 1 MG tablet [id number]  TAKE 2 TABLETS BY MOUTH ONCE DAILY WITH MEALS Justus Leita DEL, MD  Active   glimepiride  (AMARYL ) 2 MG tablet [id number] Yes Take 1 tablet (2 mg total) by mouth daily before breakfast. Justus Leita DEL, MD  Active   Glucose Blood (BLOOD GLUCOSE TEST STRIPS) STRP [id number]  May substitute to any manufacturer covered by patient's insurance. Test BS twice a day. Justus,  Leita DEL, MD  Active   Guselkumab [facility]) [id number]  Inject into the skin every 3 (three) months. [provider]  Active   lisinopril  (ZESTRIL ) 40 MG tablet [id number]  TAKE ONE TABLET BY MOUTH ONCE DAILY Berglund, Laura H, MD  Active   meloxicam (MOBIC) 15 MG tablet [id number]  Take 15 mg by mouth daily. [provider]  Active   sitaGLIPtin  (JANUVIA ) 100 MG tablet [id number]   Take 1 tablet (100 mg total) by mouth daily.  Patient not taking: Reported on 12/30/2023   Justus Leita DEL, MD  Active   Med List Note Elden, Leita DEL, MD 03/17/18 1036): Claritin or Allegra - take daily while having drainage  Melatonin 5-10 mg at bedtime for sleep  Delsym is a good over the counter cough syrup              Assessment/Plan:   Collaborate with CPhT Inocente Butcher to evaluate cost of diabetes medication management options by running test claims - Estimate cost of Januvia  through patient's plan >$300 - Estimate cost of Jardiance  through patient's plan: $60 - Note Braulio is not covered - Doreen requires prior authorization  As discussed with patient, download Jardiance  savings card from manufacturer website on his behalf and provide this to CPhT. Savings card brings cost of Jardiance  to $10 per month   Savings card billing info:   RxBIN: [id number] RxPCN: Loyalty RxGRP: [id number] ISSUER: [phone]) ID: [id number]  Patient advises that this is affordable  Send message to PCP to collaborate regarding diabetes medication management for patient. Ask if provider would like for patient to restart Jardiance  in place of Januvia  and whether patient to continue glimepiride   Diabetes: - Currently uncontrolled - Reviewed long term cardiovascular and renal outcomes of uncontrolled blood sugar - Reviewed goal A1c, goal fasting, and goal 2 hour post prandial glucose - Reviewed dietary modifications including importance of having regular well-balanced meals and snacks throughout the day, while controlling carbohydrate portion sizes  Advise against skipping meals - Counsel patient on s/s of low blood sugar and how to treat lows Review rules of 15s - importance of using 15 grams of sugar to treat low, recheck blood sugar in 15 minutes, treat again if remains low or, if back to normal, having meal if mealtime or snack  Review examples of sources of 15 grams of sugar Encourage  patient to pick up and carry glucose tablets with him in case needed for low blood sugar - Recommend to check glucose, keep log of results and have this record to review at upcoming medical appointments. Patient to contact provider office sooner if needed for readings outside of established parameters or symptoms    Follow Up Plan: Clinical Pharmacist will follow up with patient by telephone within the next 7 days  Sharyle Sia, PharmD, [facility] [phone]

## 2024-01-01 ENCOUNTER — Other Ambulatory Visit: Payer: Self-pay | Admitting: Pharmacist

## 2024-01-01 ENCOUNTER — Encounter: Payer: Self-pay | Admitting: Pharmacist

## 2024-01-01 DIAGNOSIS — E118 Type 2 diabetes mellitus with unspecified complications: Secondary | ICD-10-CM

## 2024-01-01 NOTE — Patient Instructions (Signed)
 Goals Addressed             This Visit's Progress    Pharmacy Goals       The goal A1c is less than 7%. This is the best way to reduce the risk of the long term complications of diabetes, including heart disease, kidney disease, eye disease, strokes, and nerve damage. An A1c of less than 7% corresponds with fasting sugars less than 130 and 2 hour after meal sugars less than 180.   Hypoglycemia is low blood glucose--or low blood sugar--that is below the healthy range. This is usually  when your blood glucose is less than 70 mg/dL, but you should also monitor for hypoglycemia based on symptoms.  Symptoms of hypoglycemia may include feeling: - Shaky - Sweaty - Dizziness - Confusion or difficulty speaking - Feeling weak or tired - Or you may have no symptoms at all   If low blood glucose is not treated, it can become severe and may cause you to pass out   Check your blood glucose right away if you have any symptoms of low blood sugar ( If you think your blood glucose is low but cannot check it at that time, treat anyway)  Treat by eating or drinking 15 grams of something high in sugar, such as:  4 ounces ( cup) of regular fruit juice (like orange, apple, or grape juice)  4 glucose tablets or 1 tube of glucose gel  1 tablespoon of sugar, honey, or corn syrup  4 ounces ( cup) of regular soda pop (not diet)  2 tablespoons of raisins  Wait 15 minutes and then check your blood glucose again - If it is still low, eat or drink something high in sugar again -  If your next meal is more than an hour away, eat a snack to keep your low blood glucose from coming back  Please contact your provider's office if you have concerns about your blood sugar, particularly if you have symptoms of low blood sugar.  Sharyle Sia, PharmD, [facility] [phone]

## 2024-01-01 NOTE — Progress Notes (Signed)
   01/01/2024  Patient ID: Russell Wright, male   DOB: 1961/04/27, 62 y.o.   MRN: [id number]  Collaborated with PCP regarding diabetes medication management for patient.  - Dr. Justus advised patient to continue glimepiride  and start Jardiance . Prescription for Jardiance  sent to pharmacy  Outreach to [facility] on behalf of patient to provide Jardiance  savings card info. Pharmacy confirms patient's copayment will be $10.  Follow up with patient to provide update  Will send patient handout on s/s of low blood sugar and how to treat lows via MyChart as requested   Recommend to check glucose, keep log of results and have this record to review at upcoming medical appointments. Patient to contact provider office sooner if needed for readings outside of established parameters or symptoms       Follow Up Plan:   Patient denies further medication questions or concerns today Provide patient with contact information for clinic pharmacist to contact if needed in future for medication questions/concerns    Sharyle Sia, PharmD, [facility] [phone]

## 2024-03-21 ENCOUNTER — Ambulatory Visit: Admitting: Internal Medicine

## 2024-03-21 ENCOUNTER — Encounter: Payer: Self-pay | Admitting: Internal Medicine

## 2024-03-21 VITALS — BP 126/76 | HR 90 | Ht 72.0 in | Wt 217.0 lb

## 2024-03-21 DIAGNOSIS — E118 Type 2 diabetes mellitus with unspecified complications: Secondary | ICD-10-CM | POA: Diagnosis not present

## 2024-03-21 DIAGNOSIS — Z23 Encounter for immunization: Secondary | ICD-10-CM | POA: Diagnosis not present

## 2024-03-21 DIAGNOSIS — I10 Essential (primary) hypertension: Secondary | ICD-10-CM

## 2024-03-21 DIAGNOSIS — E1169 Type 2 diabetes mellitus with other specified complication: Secondary | ICD-10-CM

## 2024-03-21 DIAGNOSIS — Z7984 Long term (current) use of oral hypoglycemic drugs: Secondary | ICD-10-CM

## 2024-03-21 DIAGNOSIS — E785 Hyperlipidemia, unspecified: Secondary | ICD-10-CM

## 2024-03-21 MED ORDER — ATORVASTATIN CALCIUM 80 MG PO TABS: 80.0000 mg | ORAL_TABLET | Freq: Every day | ORAL | 1 refills | Status: AC

## 2024-03-21 MED ORDER — LISINOPRIL 40 MG PO TABS: 40.0000 mg | ORAL_TABLET | Freq: Every day | ORAL | 1 refills | Status: AC

## 2024-03-21 NOTE — Assessment & Plan Note (Signed)
 Well controlled blood pressure today. Current regimen is lisinopril . No medication side effects noted.

## 2024-03-21 NOTE — Progress Notes (Addendum)
 Date:  03/21/2024   Name:  Russell Wright   DOB:  1961-02-23   MRN:  [id number]   Chief Complaint: Diabetes  Diabetes He presents for his follow-up diabetic visit. He has type 2 diabetes mellitus. Pertinent negatives for hypoglycemia include no headaches or nervousness/anxiousness. Pertinent negatives for diabetes include no chest pain, no fatigue, no polydipsia and no polyuria. Current diabetic treatments: Jardiance  and amaryl . He is compliant with treatment all of the time.  Hypertension This is a chronic problem. The problem is controlled. Pertinent negatives include no chest pain, headaches, palpitations or shortness of breath. Past treatments include ACE inhibitors.  Started Jardiance  in July with some patient assistance.  Review of Systems  Constitutional:  Negative for appetite change, fatigue and unexpected weight change.  HENT:  Negative for trouble swallowing.   Eyes:  Negative for visual disturbance.  Respiratory:  Negative for cough, shortness of breath and wheezing.   Cardiovascular:  Negative for chest pain, palpitations and leg swelling.  Gastrointestinal:  Negative for abdominal pain and blood in stool.  Endocrine: Negative for polydipsia and polyuria.  Skin:  Negative for color change and rash.  Neurological:  Negative for numbness and headaches.  Psychiatric/Behavioral:  Negative for dysphoric mood and sleep disturbance. The patient is not nervous/anxious.      Lab Results  Component Value Date   NA 139 07/20/2023   K 4.5 07/20/2023   CO2 19 (L) 07/20/2023   GLUCOSE 162 (H) 07/20/2023   BUN 15 07/20/2023   CREATININE 0.87 07/20/2023   CALCIUM  10.1 07/20/2023   EGFR 99 07/20/2023   GFRNONAA 87 04/29/2021   Lab Results  Component Value Date   CHOL 163 07/20/2023   HDL 36 (L) 07/20/2023   LDLCALC 92 07/20/2023   TRIG 207 (H) 07/20/2023   CHOLHDL 4.5 07/20/2023   No results found for: TSH Lab Results  Component Value Date   HGBA1C 8.3 (A) 11/20/2023    Lab Results  Component Value Date   WBC 10.4 07/20/2023   HGB 17.3 07/20/2023   HCT 52.1 (H) 07/20/2023   MCV 88 07/20/2023   PLT 279 07/20/2023   Lab Results  Component Value Date   ALT 25 07/20/2023   AST 16 07/20/2023   ALKPHOS 72 07/20/2023   BILITOT 0.5 07/20/2023   No results found for: MARIEN BOLLS, VD25OH   Patient Active Problem List   Diagnosis Date Noted   Primary insomnia 12/15/2022   Psoriatic arthritis (HCC) 06/17/2017   Essential hypertension 06/17/2017   Hyperlipidemia associated with type 2 diabetes mellitus (HCC) 06/17/2017   Type II diabetes mellitus with complication (HCC) 06/17/2017   Hair loss    Psoriasis, unspecified 09/19/2016    Allergies  Allergen Reactions   Metformin  And Related Diarrhea and Other (See Comments)    Diarrhea causing dehydration    Past Surgical History:  Procedure Laterality Date   COLONOSCOPY  05/09/2017   cleared for 10 years   TYMPANOPLASTY     repair- 63 years old    Social History   Tobacco Use   Smoking status: Never   Smokeless tobacco: Never  Vaping Use   Vaping status: Never Used  Substance Use Topics   Alcohol use: Yes    Comment: rarely   Drug use: No     Medication list has been reviewed and updated.  Current Meds  Medication Sig   Accu-Chek Softclix Lancets lancets 1 each by Other route 3 (three) times daily.   acetic  acid 2 % otic solution Place 4 drops into both ears 3 (three) times daily as needed.   Blood Glucose Monitoring Suppl (ACCU-CHEK GUIDE) w/Device KIT See admin instructions.   Blood Glucose Monitoring Suppl DEVI 1 each by Does not apply route in the morning, at noon, and at bedtime. May substitute to any manufacturer covered by patient's insurance.   clobetasol cream (TEMOVATE) 0.05 % SMARTSIG:Sparingly Topical Twice Daily (Patient taking differently: as needed.)   empagliflozin  (JARDIANCE ) 25 MG TABS tablet Take 1 tablet (25 mg total) by mouth daily before  breakfast.   folic acid (FOLVITE) 1 MG tablet TAKE 2 TABLETS BY MOUTH ONCE DAILY WITH MEALS   glimepiride  (AMARYL ) 2 MG tablet Take 1 tablet (2 mg total) by mouth daily before breakfast.   Glucose Blood (BLOOD GLUCOSE TEST STRIPS) STRP May substitute to any manufacturer covered by patient's insurance. Test BS twice a day.   Guselkumab (TREMFYA [place]) Inject into the skin every 3 (three) months.   meloxicam (MOBIC) 15 MG tablet Take 15 mg by mouth daily.   [DISCONTINUED] atorvastatin  (LIPITOR) 80 MG tablet Take 1 tablet (80 mg total) by mouth daily.   [DISCONTINUED] lisinopril  (ZESTRIL ) 40 MG tablet TAKE ONE TABLET BY MOUTH ONCE DAILY       03/21/2024    8:46 AM 12/23/2023    7:57 AM 11/20/2023    8:08 AM 07/20/2023    8:11 AM  GAD 7 : Generalized Anxiety Score  Nervous, Anxious, on Edge 0 0 3 0  Control/stop worrying 0 1 3 3   Worry too much - different things 0 1 3 3   Trouble relaxing 0 1 3 3   Restless 0 1 3 3   Easily annoyed or irritable 0 0 2 0  Afraid - awful might happen 0 0 3 1  Total GAD 7 Score 0 4 20 13   Anxiety Difficulty Not difficult at all Not difficult at all Somewhat difficult Somewhat difficult       03/21/2024    8:45 AM 12/23/2023    7:57 AM 11/20/2023    8:08 AM  Depression screen PHQ 2/9  Decreased Interest 0 0 2  Down, Depressed, Hopeless 0 0 1  PHQ - 2 Score 0 0 3  Altered sleeping 3 1 3   Tired, decreased energy 0 0 3  Change in appetite 0 0 0  Feeling bad or failure about yourself  0 1 0  Trouble concentrating 0 0 2  Moving slowly or fidgety/restless 0 0 0  Suicidal thoughts 0 0 0  PHQ-9 Score 3 2 11   Difficult doing work/chores Not difficult at all Not difficult at all Somewhat difficult    BP Readings from Last 3 Encounters:  03/21/24 126/76  12/23/23 138/86  11/20/23 118/78    Physical Exam Vitals and nursing note reviewed.  Constitutional:      General: He is not in acute distress.    Appearance: He is well-developed.  HENT:     Head:  Normocephalic and atraumatic.  Cardiovascular:     Rate and Rhythm: Normal rate and regular rhythm.  Pulmonary:     Effort: Pulmonary effort is normal. No respiratory distress.     Breath sounds: No wheezing or rhonchi.  Musculoskeletal:     Cervical back: Normal range of motion.     Right lower leg: No edema.     Left lower leg: No edema.  Lymphadenopathy:     Cervical: No cervical adenopathy.  Skin:    General: Skin is  warm and dry.     Findings: No rash.  Neurological:     Mental Status: He is alert and oriented to person, place, and time.  Psychiatric:        Mood and Affect: Mood normal.        Behavior: Behavior normal.     Wt Readings from Last 3 Encounters:  03/21/24 217 lb (98.4 kg)  12/23/23 229 lb (103.9 kg)  11/20/23 218 lb 2 oz (98.9 kg)    BP 126/76   Pulse 90   Ht 6' (1.829 m)   Wt 217 lb (98.4 kg)   SpO2 95%   BMI 29.43 kg/m   Assessment and Plan:  Problem List Items Addressed This Visit       Unprioritized   Essential hypertension (Chronic)   Well controlled blood pressure today. Current regimen is lisinopril . No medication side effects noted.        Relevant Medications   atorvastatin  (LIPITOR) 80 MG tablet   lisinopril  (ZESTRIL ) 40 MG tablet   Hyperlipidemia associated with type 2 diabetes mellitus (HCC) (Chronic)   Relevant Medications   atorvastatin  (LIPITOR) 80 MG tablet   lisinopril  (ZESTRIL ) 40 MG tablet   Type II diabetes mellitus with complication (HCC) - Primary (Chronic)   Currently medications are Jardiance  (with Patient assistance) and glimepiride .  No hypoglycemic episodes noted. Home blood sugars in the 135 range. Last visit medical regimen changes were to resume Jardiance . Lab Results  Component Value Date   HGBA1C 8.3 (A) 11/20/2023          Relevant Medications   atorvastatin  (LIPITOR) 80 MG tablet   lisinopril  (ZESTRIL ) 40 MG tablet   Other Relevant Orders   Basic metabolic panel with GFR   Hemoglobin A1c    Other Visit Diagnoses       Encounter for immunization       Relevant Orders   Flu vaccine trivalent PF, 6mos and older(Flulaval,Afluria,Fluarix,Fluzone) (Completed)     Long term current use of oral hypoglycemic drug           Return in about 4 months (around 07/22/2024) for CPX, HTN, DM TOC  Dr. Sol.    Leita HILARIO Adie, MD [facility] [place]

## 2024-03-21 NOTE — Assessment & Plan Note (Addendum)
 Currently medications are Jardiance  (with Patient assistance) and glimepiride .  No hypoglycemic episodes noted. Home blood sugars in the 135 range. Last visit medical regimen changes were to resume Jardiance . Lab Results  Component Value Date   HGBA1C 8.3 (A) 11/20/2023

## 2024-03-22 ENCOUNTER — Ambulatory Visit: Payer: Self-pay | Admitting: Internal Medicine

## 2024-03-22 DIAGNOSIS — E118 Type 2 diabetes mellitus with unspecified complications: Secondary | ICD-10-CM

## 2024-03-22 LAB — BASIC METABOLIC PANEL WITH GFR
BUN/Creatinine Ratio: 18 (ref 10–24)
BUN: 17 mg/dL (ref 8–27)
CO2: 22 mmol/L (ref 20–29)
Calcium: 10.5 mg/dL — ABNORMAL HIGH (ref 8.6–10.2)
Chloride: 103 mmol/L (ref 96–106)
Creatinine, Ser: 0.95 mg/dL (ref 0.76–1.27)
Glucose: 184 mg/dL — ABNORMAL HIGH (ref 70–99)
Potassium: 4.9 mmol/L (ref 3.5–5.2)
Sodium: 140 mmol/L (ref 134–144)
eGFR: 91 mL/min/1.73 (ref >59)

## 2024-03-22 LAB — HEMOGLOBIN A1C
Est. average glucose Bld gHb Est-mCnc: 197 mg/dL
Hgb A1c MFr Bld: 8.5 % — ABNORMAL HIGH (ref 4.8–5.6)

## 2024-03-22 MED ORDER — GLIMEPIRIDE 4 MG PO TABS: 4.0000 mg | ORAL_TABLET | Freq: Every day | ORAL | 1 refills | Status: AC

## 2024-03-23 ENCOUNTER — Encounter: Payer: Self-pay | Admitting: Internal Medicine

## 2024-03-24 ENCOUNTER — Other Ambulatory Visit: Payer: Self-pay | Admitting: Internal Medicine

## 2024-03-24 DIAGNOSIS — E118 Type 2 diabetes mellitus with unspecified complications: Secondary | ICD-10-CM

## 2024-03-25 ENCOUNTER — Telehealth: Payer: Self-pay

## 2024-03-25 NOTE — Progress Notes (Signed)
 Care Guide Pharmacy Note  03/25/2024 Name: Ade Stmarie MRN: [id number] DOB: 04-25-1961  Referred By: Justus Leita DEL, MD Reason for referral: Complex Care Management (Outreach to schedule with pharm d )   Maxmilian Trostel is a 63 y.o. year old male who is a primary care patient of Justus Leita DEL, MD.  Griffon Herberg was referred to the pharmacist for assistance related to: DMII  Successful contact was made with the patient to discuss pharmacy services including being ready for the pharmacist to call at least 5 minutes before the scheduled appointment time and to have medication bottles and any blood pressure readings ready for review. The patient agreed to meet with the pharmacist via telephone visit on (date/time).04/06/2024  Jeoffrey Buffalo , RMA     [facility], [facility] Guide  Direct Dial: [phone]  Website: [email or web address]

## 2024-03-29 ENCOUNTER — Encounter: Payer: Self-pay | Admitting: Internal Medicine

## 2024-04-06 ENCOUNTER — Other Ambulatory Visit: Payer: Self-pay | Admitting: Pharmacist

## 2024-04-06 ENCOUNTER — Telehealth: Payer: Self-pay

## 2024-04-06 ENCOUNTER — Other Ambulatory Visit: Payer: Self-pay | Admitting: Internal Medicine

## 2024-04-06 ENCOUNTER — Other Ambulatory Visit (HOSPITAL_COMMUNITY): Payer: Self-pay

## 2024-04-06 ENCOUNTER — Telehealth: Payer: Self-pay | Admitting: Pharmacist

## 2024-04-06 DIAGNOSIS — E118 Type 2 diabetes mellitus with unspecified complications: Secondary | ICD-10-CM

## 2024-04-06 MED ORDER — DAPAGLIFLOZIN PROPANEDIOL 10 MG PO TABS
10.0000 mg | ORAL_TABLET | Freq: Every day | ORAL | 1 refills | Status: AC
Start: 2024-04-06 — End: ?

## 2024-04-06 NOTE — Telephone Encounter (Signed)
 Pharmacy Patient Advocate Encounter  Insurance verification completed.   The patient is insured through [employer]   Ran test claim for [employer]. Currently a quantity of 30 is a 30 day supply and the co-pay is $15 .   This test claim was processed through [facility]- copay amounts may vary at other pharmacies due to pharmacy/plan contracts, or as the patient moves through the different stages of their insurance plan.

## 2024-04-06 NOTE — Patient Instructions (Signed)
 Goals Addressed             This Visit's Progress    Pharmacy Goals       The goal A1c is less than 7%. This is the best way to reduce the risk of the long term complications of diabetes, including heart disease, kidney disease, eye disease, strokes, and nerve damage. An A1c of less than 7% corresponds with fasting sugars less than 130 and 2 hour after meal sugars less than 180.   Please remember to take your Farxiga in the morning, as it can cause more frequent urination with more sugar in the urine. It may worsen risk for dehydration or genital infections. Focus on staying well hydrated and using good genital hygiene. Stop the medication and call our office if you develop any symptoms of genital infections, such as burning, itching, or pain while urinating or itching with redness that could be a yeast infection. If you have a day that you are vomiting or having diarrhea and you are very dehydrated, please hold this medication until you feel better.      Hypoglycemia is low blood glucose--or low blood sugar--that is below the healthy range. This is usually  when your blood glucose is less than 70 mg/dL, but you should also monitor for hypoglycemia based on symptoms.  Symptoms of hypoglycemia may include feeling: - Shaky - Sweaty - Dizziness - Confusion or difficulty speaking - Feeling weak or tired - Or you may have no symptoms at all   If low blood glucose is not treated, it can become severe and may cause you to pass out   Check your blood glucose right away if you have any symptoms of low blood sugar ( If you think your blood glucose is low but cannot check it at that time, treat anyway)  Treat by eating or drinking 15 grams of something high in sugar, such as:  4 ounces ( cup) of regular fruit juice (like orange, apple, or grape juice)  4 glucose tablets or 1 tube of glucose gel  1 tablespoon of sugar, honey, or corn syrup  4 ounces ( cup) of regular soda pop (not diet)  2  tablespoons of raisins  Wait 15 minutes and then check your blood glucose again - If it is still low, eat or drink something high in sugar again -  If your next meal is more than an hour away, eat a snack to keep your low blood glucose from coming back  Please contact your provider's office if you have concerns about your blood sugar, particularly if you have symptoms of low blood sugar.  Sharyle Sia, PharmD, [facility] [phone]

## 2024-04-06 NOTE — Progress Notes (Signed)
 Good Morning!   Reached out to patient regarding medication access for his Jardiance . Note this medication was previously affordable through his [employer] plan, but cost now >$400/month  Collaborated with [place] CPhT and found that therapeutic alternative, Doreen, is now affordable instead (suspect a formulary change occurred).  Would you please consider sending prescription for Farxiga 10 mg daily to pharmacy for him?   Also - discussed with patient working on positive lifestyle changes including limiting carbohydrates portion sizes/balanced meals and increasing his walking. Home blood sugar last checked last night before supper, reading: 158  Thank you!  Sharyle Sia, PharmD, [facility] [phone]

## 2024-04-06 NOTE — Progress Notes (Unsigned)
 Date:  04/06/2024   Name:  Russell Wright   DOB:  09-02-1960   MRN:  [id number]   Chief Complaint: No chief complaint on file.  HPI  Review of Systems   Lab Results  Component Value Date   NA 140 03/21/2024   K 4.9 03/21/2024   CO2 22 03/21/2024   GLUCOSE 184 (H) 03/21/2024   BUN 17 03/21/2024   CREATININE 0.95 03/21/2024   CALCIUM  10.5 (H) 03/21/2024   EGFR 91 03/21/2024   GFRNONAA 87 04/29/2021   Lab Results  Component Value Date   CHOL 163 07/20/2023   HDL 36 (L) 07/20/2023   LDLCALC 92 07/20/2023   TRIG 207 (H) 07/20/2023   CHOLHDL 4.5 07/20/2023   No results found for: TSH Lab Results  Component Value Date   HGBA1C 8.5 (H) 03/21/2024   Lab Results  Component Value Date   WBC 10.4 07/20/2023   HGB 17.3 07/20/2023   HCT 52.1 (H) 07/20/2023   MCV 88 07/20/2023   PLT 279 07/20/2023   Lab Results  Component Value Date   ALT 25 07/20/2023   AST 16 07/20/2023   ALKPHOS 72 07/20/2023   BILITOT 0.5 07/20/2023   No results found for: MARIEN BOLLS, VD25OH   Patient Active Problem List   Diagnosis Date Noted   Primary insomnia 12/15/2022   Psoriatic arthritis (HCC) 06/17/2017   Essential hypertension 06/17/2017   Hyperlipidemia associated with type 2 diabetes mellitus (HCC) 06/17/2017   Type II diabetes mellitus with complication (HCC) 06/17/2017   Hair loss    Psoriasis, unspecified 09/19/2016    Allergies  Allergen Reactions   Metformin  And Related Diarrhea and Other (See Comments)    Diarrhea causing dehydration    Past Surgical History:  Procedure Laterality Date   COLONOSCOPY  05/09/2017   cleared for 10 years   TYMPANOPLASTY     repair- 63 years old    Social History   Tobacco Use   Smoking status: Never   Smokeless tobacco: Never  Vaping Use   Vaping status: Never Used  Substance Use Topics   Alcohol use: Yes    Comment: rarely   Drug use: No     Medication list has been reviewed and updated.  No  outpatient medications have been marked as taking for the 04/06/24 encounter (Orders Only) with Justus Leita DEL, MD.       03/21/2024    8:46 AM 12/23/2023    7:57 AM 11/20/2023    8:08 AM 07/20/2023    8:11 AM  GAD 7 : Generalized Anxiety Score  Nervous, Anxious, on Edge 0 0 3 0  Control/stop worrying 0 1 3 3   Worry too much - different things 0 1 3 3   Trouble relaxing 0 1 3 3   Restless 0 1 3 3   Easily annoyed or irritable 0 0 2 0  Afraid - awful might happen 0 0 3 1  Total GAD 7 Score 0 4 20 13   Anxiety Difficulty Not difficult at all Not difficult at all Somewhat difficult Somewhat difficult       03/21/2024    8:45 AM 12/23/2023    7:57 AM 11/20/2023    8:08 AM  Depression screen PHQ 2/9  Decreased Interest 0 0 2  Down, Depressed, Hopeless 0 0 1  PHQ - 2 Score 0 0 3  Altered sleeping 3 1 3   Tired, decreased energy 0 0 3  Change in appetite 0 0 0  Feeling  bad or failure about yourself  0 1 0  Trouble concentrating 0 0 2  Moving slowly or fidgety/restless 0 0 0  Suicidal thoughts 0 0 0  PHQ-9 Score 3 2 11   Difficult doing work/chores Not difficult at all Not difficult at all Somewhat difficult    BP Readings from Last 3 Encounters:  03/21/24 126/76  12/23/23 138/86  11/20/23 118/78    Physical Exam  Wt Readings from Last 3 Encounters:  03/21/24 217 lb (98.4 kg)  12/23/23 229 lb (103.9 kg)  11/20/23 218 lb 2 oz (98.9 kg)    There were no vitals taken for this visit.  Assessment and Plan:  Problem List Items Addressed This Visit   None   No follow-ups on file.    Leita HILARIO Adie, MD [facility] [place]

## 2024-04-06 NOTE — Progress Notes (Signed)
 04/06/2024 Name: Russell Wright MRN: [id number] DOB: 20-Mar-1961  Chief Complaint  Patient presents with   Medication Assistance    Russell Wright is a 63 y.o. year old male who presented for a telephone visit.   They were referred to the pharmacist by their PCP for assistance in managing medication access.    Subjective:  Care Team: Primary Care Provider: Justus Leita DEL, MD/Kotturi, Mackey POUR, MD; Next Scheduled Visit: 07/29/2024   Medication Access/Adherence  Current Pharmacy:  [facility] - [place], [place] - [address] [place] [address] [place] [address] Phone: [phone] Fax: [phone]  [id number] [place], [place] - [address] AT [facility] OF [address] [place] [address] Phone: [phone] Fax: [phone]   Patient reports affordability concerns with their medications: Yes  Patient reports access/transportation concerns to their pharmacy: No  Patient reports adherence concerns with their medications:  No     Today reports cost of Jardiance  has become unaffordable as when last contacted pharmacy for a refill copayment was >$300     Diabetes:   Current medications:  - glimepiride  4 mg daily before breakfast - Jardiance  25 mg daily (currently taking from sample from office)   Medications tried in the past: metformin  (diarrhea), Jardiance  (cost), Januvia  (cost)   Reports last checked home blood sugar yesterday before supper, reading 158   Denies symptoms of hypoglycemia   Statin therapy: atorvastatin  80 mg daily  Current Physical Activity: denies exercising as much recently, but planning to get back to regular walking   Objective:  Lab Results  Component Value Date   HGBA1C 8.5 (H) 03/21/2024    Lab Results  Component Value Date   CREATININE 0.95 03/21/2024   BUN 17 03/21/2024   NA 140 03/21/2024   K 4.9 03/21/2024   CL 103  03/21/2024   CO2 22 03/21/2024    Lab Results  Component Value Date   CHOL 163 07/20/2023   HDL 36 (L) 07/20/2023   LDLCALC 92 07/20/2023   TRIG 207 (H) 07/20/2023   CHOLHDL 4.5 07/20/2023    Medications Reviewed Today     Reviewed by Alana Sharyle LABOR, RPH-CPP (Pharmacist) on 04/06/24 at 0945  Med List Status: <None>   Medication Order Taking? Sig Documenting Provider Last Dose Status Informant  Accu-Chek Softclix Lancets lancets [id number]  1 each by Other route 3 (three) times daily. [provider]  Active   acetic acid  2 % otic solution [id number]  Place 4 drops into both ears 3 (three) times daily as needed. Justus Leita DEL, MD  Active   atorvastatin  (LIPITOR) 80 MG tablet [id number]  Take 1 tablet (80 mg total) by mouth daily. Justus Leita DEL, MD  Active   Blood Glucose Monitoring Suppl (ACCU-CHEK GUIDE) w/Device PRESSLEY [id number]  See admin instructions. [provider]  Active   Blood Glucose Monitoring Suppl DEVI [id number]  1 each by Does not apply route in the morning, at noon, and at bedtime. May substitute to any manufacturer covered by patient's insurance. Justus Leita DEL, MD  Active   clobetasol cream (TEMOVATE) 0.05 % [id number]  SMARTSIG:Sparingly Topical Twice Daily  Patient taking differently: as needed.   [provider]  Active   empagliflozin  (JARDIANCE ) 25 MG TABS tablet [id number] Yes Take 1 tablet (25 mg total) by mouth daily before breakfast. Justus Leita DEL, MD  Active   folic acid RICA)  1 MG tablet [id number]  TAKE 2 TABLETS BY MOUTH ONCE DAILY WITH MEALS Berglund, Laura H, MD  Active   glimepiride  (AMARYL ) 4 MG tablet [id number] Yes Take 1 tablet (4 mg total) by mouth daily before breakfast. Justus Leita DEL, MD  Active   Glucose Blood (BLOOD GLUCOSE TEST STRIPS) STRP [id number]  May substitute to any manufacturer covered by patient's insurance. Test BS twice a day. Justus Leita DEL, MD  Active   Guselkumab (TREMFYA [place])  [id number]  Inject into the skin every 3 (three) months. [provider]  Active   lisinopril  (ZESTRIL ) 40 MG tablet [id number]  Take 1 tablet (40 mg total) by mouth daily. Justus Leita DEL, MD  Active   meloxicam [facility]) 15 MG tablet [id number]  Take 15 mg by mouth daily. [provider]  Active   Med List Note Elden, Leita DEL, MD 03/17/18 1036): Claritin or Allegra - take daily while having drainage  Melatonin 5-10 mg at bedtime for sleep  Delsym is a good over the counter cough syrup              Assessment/Plan:   Outreach to [employer] on behalf of patient. Speak with pharmacy technician who advises that patient's cost for Jardiance  through both his prescription plan + savings card is >$300  Collaborate with CPhT Inocente Butcher to evaluate cost of diabetes medication management options by running test claims - Find that therapeutic alternative Doreen is now $60/month through patient's plan.   Collaborate with PCP to recommend switch from Jardiance  to Farxiga for affordability - Provider sends new prescription for Farxiga 10 mg daily to pharmacy for patient  Follow up with [facility] and speak with Kaylee. Provide manufacturer savings card for Nils ALLY: [id number] PCN: [id number] Group: [id number] ID: [id number]  - Patient's copayment is $0 with savings card   Follow up with patient to provide update. Patient verbalizes understanding of plan to switch from Jardiance  to Farxiga once uses up current supply of Jardiance   Diabetes: - Currently uncontrolled - Reviewed long term cardiovascular and renal outcomes of uncontrolled blood sugar - Reviewed goal A1c, goal fasting, and goal 2 hour post prandial glucose - Reviewed dietary modifications including importance of having regular well-balanced meals and snacks throughout the day, while controlling carbohydrate portion sizes             Have advised against skipping meals - Have counseled  patient on s/s of low blood sugar and how to treat lows - Patient planning to work on increasing his walking - Recommend to check glucose, keep log of results and have this record to review at upcoming medical appointments. Patient to contact provider office sooner if needed for readings outside of established parameters or symptoms       Follow Up Plan: Clinical Pharmacist will follow up with patient by telephone on 06/15/2024 at 9:00 AM    Sharyle Sia, PharmD, [facility] [phone]

## 2024-05-03 ENCOUNTER — Other Ambulatory Visit: Payer: Self-pay | Admitting: Internal Medicine

## 2024-05-03 DIAGNOSIS — E118 Type 2 diabetes mellitus with unspecified complications: Secondary | ICD-10-CM

## 2024-05-04 NOTE — Telephone Encounter (Signed)
 Requested medication (s) are due for refill today: yes  Requested medication (s) are on the active medication list: no  Last refill:  12/15/22  Future visit scheduled: yes  Notes to clinic:  Historical medication     Requested Prescriptions  Pending Prescriptions Disp Refills   Accu-Chek Softclix Lancets lancets [Pharmacy Med Name: Accu-Chek Softclix Lancets] 100 each 0    Sig: USE AS DIRECTED TO test blood sugar THREE TIMES DAILY     Endocrinology: Diabetes - Testing Supplies Passed - 05/04/2024 11:05 AM      Passed - Valid encounter within last 12 months    Recent Outpatient Visits           1 month ago Type II diabetes mellitus with complication [facility])   [place] [facility] at [facility], Leita DEL, MD   4 months ago Excessive cerumen in both ear canals   [place] [facility] at [facility], Leita DEL, MD   5 months ago Type II diabetes mellitus with complication [facility])   [place] [facility] at [facility], Leita DEL, MD   9 months ago Annual physical exam   [facility] at [facility], Leita DEL, MD

## 2024-06-15 ENCOUNTER — Other Ambulatory Visit: Payer: Self-pay | Admitting: Pharmacist

## 2024-06-22 ENCOUNTER — Other Ambulatory Visit (HOSPITAL_COMMUNITY): Payer: Self-pay

## 2024-06-22 ENCOUNTER — Other Ambulatory Visit: Payer: Self-pay | Admitting: Pharmacist

## 2024-06-22 DIAGNOSIS — E118 Type 2 diabetes mellitus with unspecified complications: Secondary | ICD-10-CM

## 2024-06-22 NOTE — Progress Notes (Signed)
 "  06/22/2024 Name: Russell Wright MRN: [id number] DOB: [birth year]  Chief Complaint  Patient presents with   Medication Management   Medication Assistance    Russell Wright is a 64 y.o. year old male who presented for a telephone visit.   They were referred to the pharmacist by their PCP for assistance in managing medication access.      Subjective:   Care Team: Primary Care Provider: Justus Leita DEL, MD/Kotturi, Mackey POUR, MD; Next Scheduled Visit: 07/29/2024  Medication Access/Adherence  Current Pharmacy:  [facility] - [place], [place] - [address] [place] [address] [place] [address] Phone: [phone] Fax: [phone]  [id number] [place], [place] - [address] AT [facility] OF [address] [place] [address] Phone: [phone] Fax: [phone]   Patient reports affordability concerns with their medications: Yes  Patient reports access/transportation concerns to their pharmacy: No  Patient reports adherence concerns with their medications:  No       Diabetes:   Current medications:  - glimepiride  4 mg daily before breakfast - Farxiga  10 mg daily   Medications tried in the past: metformin  (diarrhea), Jardiance  (cost), Januvia  (cost); preference to avoid injectable medications   Reports recent morning blood sugar readings ranging 170s-190s   Reports has significantly cut back on his intake of bread, but admits to eating large portion sizes of carbohydrates (potatoes, macaroni)  Current dietary habits: - Breakfast: egg sandwich (2 pieces of bread) - Lunch: pack of Nabs (peanut butter crackers) - Supper: spaghetti or pork chops or hamburger - Drinks: water or diet Coke or Gatorade Zero; large glass of 2% milk before bed - Snacks: occasionally before bed has a pack of Nabs or Ho-ho  Denies symptoms of hypoglycemia   Statin therapy: atorvastatin  80 mg  daily   Current Physical Activity: has been increasing exercise, now walking ~1 mile every other day   Objective:  Lab Results  Component Value Date   HGBA1C 8.5 (H) 03/21/2024    Lab Results  Component Value Date   CREATININE 0.95 03/21/2024   BUN 17 03/21/2024   NA 140 03/21/2024   K 4.9 03/21/2024   CL 103 03/21/2024   CO2 22 03/21/2024    Lab Results  Component Value Date   CHOL 163 07/20/2023   HDL 36 (L) 07/20/2023   LDLCALC 92 07/20/2023   TRIG 207 (H) 07/20/2023   CHOLHDL 4.5 07/20/2023    Medications Reviewed Today     Reviewed by Russell Wright, RPH-CPP (Pharmacist) on 06/23/24 at 1139  Med List Status: <None>   Medication Order Taking? Sig Documenting Provider Last Dose Status Informant  Accu-Chek Softclix Lancets lancets [id number]  USE AS DIRECTED TO test blood sugar THREE TIMES DAILY Russell Leita DEL, MD  Active   acetic acid  2 % otic solution [id number]  Place 4 drops into both ears 3 (three) times daily as needed. Russell Leita DEL, MD  Active   atorvastatin  (LIPITOR) 80 MG tablet [id number]  Take 1 tablet (80 mg total) by mouth daily. Russell Leita DEL, MD  Active   Blood Glucose Monitoring Suppl (ACCU-CHEK GUIDE) w/Device Russell Wright [id number]  See admin instructions. [provider]  Active   Blood Glucose Monitoring Suppl DEVI [id number]  1 each by Does not apply route in the morning, at noon, and at bedtime. May substitute to any manufacturer covered by patient's insurance. Russell Leita DEL,  MD  Active   clobetasol cream (TEMOVATE) 0.05 % [id number]  SMARTSIG:Sparingly Topical Twice Daily  Patient taking differently: as needed.   [provider]  Active   dapagliflozin  propanediol (FARXIGA ) 10 MG TABS tablet [id number] Yes Take 1 tablet (10 mg total) by mouth daily. Russell Leita DEL, MD  Active   folic acid (FOLVITE) 1 MG tablet [id number]  TAKE 2 TABLETS BY MOUTH ONCE DAILY WITH MEALS Berglund, Laura H, MD  Active   glimepiride  (AMARYL ) 4  MG tablet [id number] Yes Take 1 tablet (4 mg total) by mouth daily before breakfast. Russell Leita DEL, MD  Active   Glucose Blood (BLOOD GLUCOSE TEST STRIPS) STRP [id number]  May substitute to any manufacturer covered by patient's insurance. Test BS twice a day. Russell Leita DEL, MD  Active   Guselkumab (TREMFYA [place]) [id number]  Inject into the skin every 3 (three) months. [provider]  Active   lisinopril  (ZESTRIL ) 40 MG tablet [id number]  Take 1 tablet (40 mg total) by mouth daily. Russell Leita DEL, MD  Active   meloxicam [facility]) 15 MG tablet [id number]  Take 15 mg by mouth daily. [provider]  Active   Med List Note Elden, Leita DEL, MD 03/17/18 1036): Claritin or Allegra - take daily while having drainage  Melatonin 5-10 mg at bedtime for sleep  Delsym is a good over the counter cough syrup              Assessment/Plan:     Diabetes: - Currently uncontrolled - Reviewed long term cardiovascular and renal outcomes of uncontrolled blood sugar - Reviewed goal A1c, goal fasting, and goal 2 hour post prandial glucose - Reviewed dietary modifications including importance of having regular well-balanced meals and snacks throughout the day, while controlling carbohydrate portion sizes             Have advised against skipping meals  Discuss ideas for more balanced snacks  Encourage patient to drink water as beverage of choice  Encourage patient to work on increasing intake of non-starchy vegetables  - Have counseled patient on s/s of low blood sugar and how to treat lows - Patient planning to work on increasing his walking - Discuss diabetes medication management options. Patient interested in Rybelsus, both for blood sugar impact and weight benefits, if affordable through his insurance coverage. Note patient not interested other GLP-1 receptor agonists as prefers to avoid injections Collaborate with CPhT Inocente Butcher to evaluate cost of Rybelsus by running test  claims. Find that cost of maintenance dose of Rybelsus, with manufacturer savings card, would be $25/month  Patient states that this cost would be affordable Send message to PCP regarding recommendation to start patient on Rybelsus - Recommend to check glucose, keep log of results and have this record to review at upcoming medical appointments. Patient to contact provider office sooner if needed for readings outside of established parameters or symptoms       Follow Up Plan: Clinical Pharmacist will follow up with patient by telephone on 08/24/2024 at 9:00 AM     Sharyle Sia, PharmD, [facility] [phone]   "

## 2024-06-23 ENCOUNTER — Telehealth: Payer: Self-pay

## 2024-06-23 ENCOUNTER — Other Ambulatory Visit (HOSPITAL_COMMUNITY): Payer: Self-pay

## 2024-06-23 NOTE — Telephone Encounter (Signed)
 Pharmacy Patient Advocate Encounter  Insurance verification completed.   The patient is insured through [employer]   Ran test claim for Rybelsus. Currently a quantity of 30 is a 30 day supply and the co-pay is 25 . CLAIM PROCESSED WITH COUPON CARD  This test claim was processed through [facility]- copay amounts may vary at other pharmacies due to pharmacy/plan contracts, or as the patient moves through the different stages of their insurance plan.

## 2024-06-24 NOTE — Patient Instructions (Addendum)
 Goals Addressed             This Visit's Progress    Pharmacy Goals       The goal A1c is less than 7%. This is the best way to reduce the risk of the long term complications of diabetes, including heart disease, kidney disease, eye disease, strokes, and nerve damage. An A1c of less than 7% corresponds with fasting sugars less than 130 and 2 hour after meal sugars less than 180.   Please remember to take your Farxiga in the morning, as it can cause more frequent urination with more sugar in the urine. It may worsen risk for dehydration or genital infections. Focus on staying well hydrated and using good genital hygiene. Stop the medication and call our office if you develop any symptoms of genital infections, such as burning, itching, or pain while urinating or itching with redness that could be a yeast infection. If you have a day that you are vomiting or having diarrhea and you are very dehydrated, please hold this medication until you feel better.      Hypoglycemia is low blood glucose--or low blood sugar--that is below the healthy range. This is usually  when your blood glucose is less than 70 mg/dL, but you should also monitor for hypoglycemia based on symptoms.  Symptoms of hypoglycemia may include feeling: - Shaky - Sweaty - Dizziness - Confusion or difficulty speaking - Feeling weak or tired - Or you may have no symptoms at all   If low blood glucose is not treated, it can become severe and may cause you to pass out   Check your blood glucose right away if you have any symptoms of low blood sugar ( If you think your blood glucose is low but cannot check it at that time, treat anyway)  Treat by eating or drinking 15 grams of something high in sugar, such as:  4 ounces ( cup) of regular fruit juice (like orange, apple, or grape juice)  4 glucose tablets or 1 tube of glucose gel  1 tablespoon of sugar, honey, or corn syrup  4 ounces ( cup) of regular soda pop (not diet)  2  tablespoons of raisins  Wait 15 minutes and then check your blood glucose again - If it is still low, eat or drink something high in sugar again -  If your next meal is more than an hour away, eat a snack to keep your low blood glucose from coming back  Please contact your provider's office if you have concerns about your blood sugar, particularly if you have symptoms of low blood sugar.  Sharyle Sia, PharmD, [facility] [phone]

## 2024-07-29 ENCOUNTER — Encounter: Admitting: Family Medicine

## 2024-08-24 ENCOUNTER — Other Ambulatory Visit
